# Patient Record
Sex: Female | Born: 1995 | Hispanic: Yes | Marital: Married | Smoking: Never smoker
Health system: Southern US, Community
[De-identification: ages and names within clinical notes are randomized; demographics above are authoritative.]

## PROBLEM LIST (undated history)

## (undated) ENCOUNTER — Inpatient Hospital Stay: Payer: Self-pay

## (undated) DIAGNOSIS — Z8759 Personal history of other complications of pregnancy, childbirth and the puerperium: Secondary | ICD-10-CM

## (undated) HISTORY — PX: NO PAST SURGERIES: SHX2092

---

## 2014-03-21 ENCOUNTER — Ambulatory Visit: Payer: Self-pay | Admitting: Advanced Practice Midwife

## 2014-07-08 ENCOUNTER — Observation Stay
Admit: 2014-07-08 | Disposition: A | Discharge status: Home / Self Care | Payer: Self-pay | Attending: Obstetrics and Gynecology | Admitting: Obstetrics and Gynecology

## 2017-04-06 NOTE — L&D Delivery Note (Signed)
Obstetrical Delivery Note   Date of Delivery:   12/05/2017 Primary OB:   [facility] Gestational Age/EDD: [redacted]w[redacted]d (Dated by 13 week ultrasound) Antepartum complications: none  Delivered By:   Farrel Conners, CNM  Delivery Type:   spontaneous vaginal delivery  Procedure Details:   CTSP with urge to push. Anterior lip reduced with one push. Mother continued to push to deliver a vigorous female infant followed by a large gush of dark green meconium stained amniotic fluid. Baby dried and placed on mother's abdomen. Mouth suctioned with bulb syringe. After a delayed cord clamping, the cord was cut by the father of the baby. Baby then placed skin to skin on mother's chest. Spontaneous delivery of intact placenta and 3 vessel cord. Small first degree perineal laceration hemoststic and not needing repair. No vaginal or cervical lacerations seen. EBL: 350 ml. Anesthesia:    none Intrapartum complications: None GBS:    negative Laceration:    1st degree and perineal-not needing repair Episiotomy:    none Placenta:    Via active 3rd stage. To pathology: no Estimated Blood Loss:  350 ml Baby:    Liveborn female, Apgars 8/9, weight 6#11.9oz    Farrel Conners, CNM

## 2017-05-19 ENCOUNTER — Other Ambulatory Visit: Payer: Self-pay | Admitting: Advanced Practice Midwife

## 2017-05-19 DIAGNOSIS — Z369 Encounter for antenatal screening, unspecified: Secondary | ICD-10-CM

## 2017-05-20 LAB — OB RESULTS CONSOLE HEPATITIS B SURFACE ANTIGEN: HEP B S AG: NEGATIVE

## 2017-06-07 ENCOUNTER — Encounter: Payer: Self-pay | Admitting: *Deleted

## 2017-06-07 ENCOUNTER — Ambulatory Visit (HOSPITAL_BASED_OUTPATIENT_CLINIC_OR_DEPARTMENT_OTHER)
Admission: RE | Admit: 2017-06-07 | Discharge: 2017-06-07 | Disposition: A | Discharge status: Home / Self Care | Payer: Self-pay | Source: Ambulatory Visit | Attending: Obstetrics and Gynecology | Admitting: Obstetrics and Gynecology

## 2017-06-07 ENCOUNTER — Ambulatory Visit
Admission: RE | Admit: 2017-06-07 | Discharge: 2017-06-07 | Disposition: A | Discharge status: Home / Self Care | Payer: Self-pay | Source: Ambulatory Visit | Attending: Advanced Practice Midwife | Admitting: Advanced Practice Midwife

## 2017-06-07 VITALS — BP 133/73 | HR 109 | Temp 98.3°F | Resp 16 | Ht 59.0 in | Wt 154.6 lb

## 2017-06-07 DIAGNOSIS — Z369 Encounter for antenatal screening, unspecified: Secondary | ICD-10-CM

## 2017-06-07 DIAGNOSIS — Z3A13 13 weeks gestation of pregnancy: Secondary | ICD-10-CM | POA: Insufficient documentation

## 2017-06-07 DIAGNOSIS — Z315 Encounter for genetic counseling: Secondary | ICD-10-CM | POA: Insufficient documentation

## 2017-06-07 DIAGNOSIS — Z3682 Encounter for antenatal screening for nuchal translucency: Secondary | ICD-10-CM | POA: Insufficient documentation

## 2017-06-07 NOTE — Progress Notes (Addendum)
Department, [place]* Length of Consultation: 30 minutes   [name]  [facility] to review prenatal screening and testing options.  This note summarizes the information we discussed with the aid of a Spanish interpreter.    We offered the following routine screening tests for this pregnancy:  First trimester screening, which includes nuchal translucency ultrasound screen and first trimester maternal serum marker screening.  The nuchal translucency has approximately an 80% detection rate for Down syndrome and can be positive for other chromosome abnormalities as well as congenital heart defects.  When combined with a maternal serum marker screening, the detection rate is up to 90% for Down syndrome and up to 97% for trisomy 18.     Maternal serum marker screening, a blood test that measures pregnancy proteins, can provide risk assessments for Down syndrome, trisomy 18, and open neural tube defects (spina bifida, anencephaly). Because it does not directly examine the fetus, it cannot positively diagnose or rule out these problems.  Targeted ultrasound uses high frequency sound waves to create an image of the developing fetus.  An ultrasound is often recommended as a routine means of evaluating the pregnancy.  It is also used to screen for fetal anatomy problems (for example, a heart defect) that might be suggestive of a chromosomal or other abnormality.   Should these screening tests indicate an increased concern, then the following additional testing options would be offered:  The chorionic villus sampling procedure is available for first trimester chromosome analysis.  This involves the withdrawal of a small amount of chorionic villi (tissue from the developing placenta).  Risk of pregnancy loss is estimated to be approximately 1 in 200 to 1 in 100 (0.5 to 1%).  There is approximately a 1% (1 in 100) chance that the  CVS chromosome results will be unclear.  Chorionic villi cannot be tested for neural tube defects.     Amniocentesis involves the removal of a small amount of amniotic fluid from the sac surrounding the fetus with the use of a thin needle inserted through the maternal abdomen and uterus.  Ultrasound guidance is used throughout the procedure.  Fetal cells from amniotic fluid are directly evaluated and > 99.5% of chromosome problems and > 98% of open neural tube defects can be detected. This procedure is generally performed after the 15th week of pregnancy.  The main risks to this procedure include complications leading to miscarriage in less than 1 in 200 cases (0.5%).  As another option for information if the pregnancy is suspected to be an an increased chance for certain chromosome conditions, we also reviewed the availability of cell free fetal DNA testing from maternal blood to determine whether or not the baby may have either Down syndrome, trisomy 2613, or trisomy 10018.  This test utilizes a maternal blood sample and DNA sequencing technology to isolate circulating cell free fetal DNA from maternal plasma.  The fetal DNA can then be analyzed for DNA sequences that are derived from the three most common chromosomes involved in aneuploidy, chromosomes 13, 18, and 21.  If the overall amount of DNA is greater than the expected level for any of these chromosomes, aneuploidy is suspected.  While we do not consider it a replacement for invasive testing and karyotype analysis, a negative result from this testing would be reassuring, though not a guarantee of a normal chromosome complement for the baby.  An abnormal result is certainly suggestive of  an abnormal chromosome complement, though we would still recommend CVS or amniocentesis to confirm any findings from this testing.  Cystic Fibrosis and Spinal Muscular Atrophy (SMA) screening were also discussed with the patient. Both conditions are recessive, which means  that both parents must be carriers in order to have a child with the disease.  Cystic fibrosis (CF) is one of the most common genetic conditions in persons of Caucasian ancestry.  This condition occurs in approximately 1 in 2,500 Caucasian persons and results in thickened secretions in the lungs, digestive, and reproductive systems.  For a baby to be at risk for having CF, both of the parents must be carriers for this condition.  Approximately 1 in 63 Caucasian persons is a carrier for CF.  Current carrier testing looks for the most common mutations in the gene for CF and can detect approximately 90% of carriers in the Caucasian population.  This means that the carrier screening can greatly reduce, but cannot eliminate, the chance for an individual to have a child with CF.  If an individual is found to be a carrier for CF, then carrier testing would be available for the partner. As part of [place]'s newborn screening profile, all babies born in the state of [place] will have a two-tier screening process.  Specimens are first tested to determine the concentration of immunoreactive trypsinogen (IRT).  The top 5% of specimens with the highest IRT values then undergo DNA testing using a panel of over 40 common CF mutations. SMA is a neurodegenerative disorder that leads to atrophy of skeletal muscle and overall weakness.  This condition is also more prevalent in the Caucasian population, with 1 in 40-1 in 60 persons being a carrier and 1 in 6,000-1 in 10,000 children being affected.  There are multiple forms of the disease, with some causing death in infancy to other forms with survival into adulthood.  The genetics of SMA is complex, but carrier screening can detect up to 95% of carriers in the Caucasian population.  Similar to CF, a negative result can greatly reduce, but cannot eliminate, the chance to have a child with SMA.  We obtained a detailed family history and pregnancy history.  The family  history was reported to be unremarkable for birth defects, intellectual delays, recurrent pregnancy loss or known chromosome abnormalities.  [name] stated that this is the second pregnancy for she and her partner, Heather Shea.  She reported no complications or exposures that would be expected to increase the risk for birth defects.  After consideration of the options, [name] elected to proceed with an ultrasound and first trimester screening.  An ultrasound was performed at the time of the visit.  The gestational age was consistent with 13 weeks.  Fetal anatomy could not be assessed due to early gestational age.  Please refer to the ultrasound report for details of that study.  [name] was encouraged to call with questions or concerns.  We can be contacted at [phone].    Cherly Anderson, MS, CGC  Katrina Stack, MS, CGC performed an integral service incident to the physician's initial service.  I was physically present in the clinical area and was immediately available to render assistance.   Elpidia Karn C Tryton Bodi

## 2017-06-10 ENCOUNTER — Telehealth: Payer: Self-pay | Admitting: Obstetrics and Gynecology

## 2017-06-10 NOTE — Telephone Encounter (Signed)
  [name] elected to undergo First Trimester screening on 06/07/2017.  To review, first trimester screening, includes nuchal translucency ultrasound screen and/or first trimester maternal serum marker screening.  The nuchal translucency has approximately an 80% detection rate for Down syndrome and can be positive for other chromosome abnormalities as well as heart defects.  When combined with a maternal serum marker screening, the detection rate is up to 90% for Down syndrome and up to 97% for trisomy 13 and 18.     The results of the First Trimester Nuchal Translucency and Biochemical Screening were within normal range.  The risk for Down syndrome is now estimated to be less than 1 in 10,000.  The risk for Trisomy 13/18 is also estimated to be less than 1 in 10,000.  Should more definitive information be desired, we would offer amniocentesis.  Because we do not yet know the effectiveness of combined first and second trimester screening, we do not recommend a maternal serum screen to assess the chance for chromosome conditions.  However, if screening for neural tube defects is desired, maternal serum screening for AFP only can be performed between 15 and [redacted] weeks gestation.      Heather Andersoneborah F. Kenae Lindquist, MS, CGC

## 2017-06-28 ENCOUNTER — Other Ambulatory Visit: Payer: Self-pay | Admitting: *Deleted

## 2017-06-28 DIAGNOSIS — Z3689 Encounter for other specified antenatal screening: Secondary | ICD-10-CM

## 2017-06-28 DIAGNOSIS — Z369 Encounter for antenatal screening, unspecified: Secondary | ICD-10-CM

## 2017-07-01 ENCOUNTER — Ambulatory Visit
Admission: RE | Admit: 2017-07-01 | Discharge: 2017-07-01 | Disposition: A | Discharge status: Home / Self Care | Payer: Self-pay | Source: Ambulatory Visit | Attending: Obstetrics & Gynecology | Admitting: Obstetrics & Gynecology

## 2017-07-01 ENCOUNTER — Other Ambulatory Visit: Payer: Self-pay

## 2017-07-01 DIAGNOSIS — Z3689 Encounter for other specified antenatal screening: Secondary | ICD-10-CM | POA: Insufficient documentation

## 2017-07-01 DIAGNOSIS — Z3A17 17 weeks gestation of pregnancy: Secondary | ICD-10-CM | POA: Insufficient documentation

## 2017-09-13 LAB — OB RESULTS CONSOLE HIV ANTIBODY (ROUTINE TESTING): HIV: NONREACTIVE

## 2017-09-13 LAB — OB RESULTS CONSOLE RPR: RPR: NONREACTIVE

## 2017-09-27 ENCOUNTER — Other Ambulatory Visit: Payer: Self-pay

## 2017-09-27 ENCOUNTER — Observation Stay
Admission: EM | Admit: 2017-09-27 | Discharge: 2017-09-27 | Disposition: A | Discharge status: Home / Self Care | Payer: Self-pay | Attending: Obstetrics and Gynecology | Admitting: Obstetrics and Gynecology

## 2017-09-27 ENCOUNTER — Encounter: Payer: Self-pay | Admitting: Maternal Newborn

## 2017-09-27 DIAGNOSIS — O368190 Decreased fetal movements, unspecified trimester, not applicable or unspecified: Secondary | ICD-10-CM | POA: Diagnosis present

## 2017-09-27 DIAGNOSIS — Z3A29 29 weeks gestation of pregnancy: Secondary | ICD-10-CM | POA: Insufficient documentation

## 2017-09-27 DIAGNOSIS — O368130 Decreased fetal movements, third trimester, not applicable or unspecified: Secondary | ICD-10-CM

## 2017-09-27 NOTE — Final Progress Note (Signed)
Physician Final Progress Note  Patient ID: Heather BottomsLorena Sibrian Alberto MRN: [id number] DOB/AGE: [birth year] 22 y.o.  Admit date: 09/27/2017 Admitting provider: Conard NovakStephen D Jackson, MD Discharge date: 09/27/2017   Admission Diagnoses: Decreased fetal movement, difficulty finding FHT in clinic.  Discharge Diagnoses:  Active Problems:   * No active hospital problems. *   History of Present Illness: The patient is a 22 y.o. female G2P0 at 6655w5d who presents for a check on fetal well-being. She had not felt strong fetal movements for a period of two days. She thinks she felt a slight movement at 1500 today. Her provider in clinic had difficulty  finding FHT on Doppler and NST and sent her here for further evaluation. Denies contractions, vaginal bleeding, and loss of fluid.  ROS: Review of systems negative unless otherwise noted in HPI  History reviewed. No pertinent past medical history.  History reviewed. No pertinent surgical history.  No current facility-administered medications on file prior to encounter.    Current Outpatient Medications on File Prior to Encounter  Medication Sig Dispense Refill  . Prenatal Vit-Fe Fumarate-FA (PRENATAL MULTIVITAMIN) TABS tablet Take 1 tablet by mouth daily at 12 noon.      No Known Allergies  Social History   Socioeconomic History  . Marital status: Single    Spouse name: Not on file  . Number of children: Not on file  . Years of education: Not on file  . Highest education level: Not on file  Occupational History  . Not on file  Social Needs  . Financial resource strain: Not on file  . Food insecurity:    Worry: Not on file    Inability: Not on file  . Transportation needs:    Medical: Not on file    Non-medical: Not on file  Tobacco Use  . Smoking status: Never Smoker  . Smokeless tobacco: Never Used  Substance and Sexual Activity  . Alcohol use: No    Frequency: Never  . Drug use: No  . Sexual activity: Yes  Lifestyle  . Physical  activity:    Days per week: Not on file    Minutes per session: Not on file  . Stress: Not on file  Relationships  . Social connections:    Talks on phone: Not on file    Gets together: Not on file    Attends religious service: Not on file    Active member of club or organization: Not on file    Attends meetings of clubs or organizations: Not on file    Relationship status: Not on file  . Intimate partner violence:    Fear of current or ex partner: Not on file    Emotionally abused: Not on file    Physically abused: Not on file    Forced sexual activity: Not on file  Other Topics Concern  . Not on file  Social History Narrative  . Not on file   History reviewed. No pertinent family history.   Physical Exam: BP 132/67   Pulse (!) 117   Temp 98.2 F (36.8 C) (Oral)   Resp 18   LMP 03/12/2017   Gen: NAD CV: Tachycardia Pulm: No increased work of breathing Pelvic: Deferred Ext: No signs of DVT  Bedside ultrasound performed by Dr. Thomasene MohairStephen Jackson showed good fetal cardiac activity in the 150s, cephalic singleton with anterior placenta, fetal movements present.  NST Baseline: 150 Variability: moderate Accelerations: present, 15x15 Decelerations: absent Tocometry: none The patient was monitored for 40+ minutes,  fetal heart rate tracing was deemed reactive, category I tracing.  Consults: None  Procedures: Bedside ultrasound, NST  Discharge Condition: good  Disposition: Discharge disposition: 01-Home or Self Care       Diet: Regular diet  Discharge Activity: Activity as tolerated  Discharge Instructions    Discharge activity:  No Restrictions   Complete by:  As directed    Discharge diet:  No restrictions   Complete by:  As directed    No sexual activity restrictions   Complete by:  As directed    Notify physician for a general feeling that "something is not right"   Complete by:  As directed    Notify physician for increase or change in vaginal  discharge   Complete by:  As directed    Notify physician for intestinal cramps, with or without diarrhea, sometimes described as "gas pain"   Complete by:  As directed    Notify physician for leaking of fluid   Complete by:  As directed    Notify physician for low, dull backache, unrelieved by heat or Tylenol   Complete by:  As directed    Notify physician for menstrual like cramps   Complete by:  As directed    Notify physician for pelvic pressure   Complete by:  As directed    Notify physician for uterine contractions.  These may be painless and feel like the uterus is tightening or the baby is  "balling up"   Complete by:  As directed    Notify physician for vaginal bleeding   Complete by:  As directed    PRETERM LABOR:  Includes any of the follwing symptoms that occur between 20 - [redacted] weeks gestation.  If these symptoms are not stopped, preterm labor can result in preterm delivery, placing your baby at risk   Complete by:  As directed      Allergies as of 09/27/2017   No Known Allergies     Medication List    TAKE these medications   prenatal multivitamin Tabs tablet Take 1 tablet by mouth daily at 12 noon.      Patient reassured with normal fetal findings, advised to return to triage with decreased fetal movement or signs of preterm labor.  Signed: Oswaldo Conroy, CNM  09/27/2017, 8:14 PM

## 2017-09-27 NOTE — OB Triage Note (Signed)
Ms. Heather OverlieSibrian Shea sent over from [facility] with decreased fetal movement, provider at [facility] unable to find FHT with doppler or US. Pt reports decreased fetal movement for 2 days, states she felt "a small movement" at approx 3 pm today, denies bleeding, LOF, falls, accidents.

## 2017-09-27 NOTE — Discharge Summary (Signed)
See Final Progress Note 09/27/2017.

## 2017-11-15 LAB — OB RESULTS CONSOLE GC/CHLAMYDIA
Chlamydia: NEGATIVE
Gonorrhea: NEGATIVE

## 2017-11-15 LAB — OB RESULTS CONSOLE RUBELLA ANTIBODY, IGM: RUBELLA: IMMUNE

## 2017-11-15 LAB — OB RESULTS CONSOLE GBS: STREP GROUP B AG: NEGATIVE

## 2017-11-15 LAB — OB RESULTS CONSOLE VARICELLA ZOSTER ANTIBODY, IGG: VARICELLA IGG: NON-IMMUNE/NOT IMMUNE

## 2017-12-05 ENCOUNTER — Encounter: Payer: Self-pay | Admitting: Certified Nurse Midwife

## 2017-12-05 ENCOUNTER — Other Ambulatory Visit: Payer: Self-pay

## 2017-12-05 ENCOUNTER — Inpatient Hospital Stay
Admission: EM | Admit: 2017-12-05 | Discharge: 2017-12-07 | DRG: 807 | Disposition: A | Discharge status: Home / Self Care | Payer: Medicaid Other | Attending: Obstetrics and Gynecology | Admitting: Obstetrics and Gynecology

## 2017-12-05 DIAGNOSIS — Z3483 Encounter for supervision of other normal pregnancy, third trimester: Secondary | ICD-10-CM | POA: Diagnosis present

## 2017-12-05 DIAGNOSIS — Z3A39 39 weeks gestation of pregnancy: Secondary | ICD-10-CM | POA: Diagnosis not present

## 2017-12-05 HISTORY — DX: Personal history of other complications of pregnancy, childbirth and the puerperium: Z87.59

## 2017-12-05 LAB — CBC
HCT: 34.4 % — ABNORMAL LOW (ref 35.0–47.0)
HEMOGLOBIN: 11.5 g/dL — AB (ref 12.0–16.0)
MCH: 28.1 pg (ref 26.0–34.0)
MCHC: 33.3 g/dL (ref 32.0–36.0)
MCV: 84.3 fL (ref 80.0–100.0)
PLATELETS: 295 10*3/uL (ref 150–440)
RBC: 4.09 MIL/uL (ref 3.80–5.20)
RDW: 15.6 % — ABNORMAL HIGH (ref 11.5–14.5)
WBC: 13.9 10*3/uL — ABNORMAL HIGH (ref 3.6–11.0)

## 2017-12-05 LAB — TYPE AND SCREEN
ABO/RH(D): A POS
Antibody Screen: NEGATIVE

## 2017-12-05 MED ORDER — PRENATAL MULTIVITAMIN CH
1.0000 | ORAL_TABLET | Freq: Every day | ORAL | Status: DC
  Administered 2017-12-06 – 2017-12-07 (×2): 1 via ORAL
  Filled 2017-12-05 (×2): qty 1

## 2017-12-05 MED ORDER — COCONUT OIL OIL
1.0000 "application " | TOPICAL_OIL | Status: DC | PRN
  Filled 2017-12-05: qty 120

## 2017-12-05 MED ORDER — LIDOCAINE HCL (PF) 1 % IJ SOLN: 30.0000 mL | INTRAMUSCULAR | Status: DC | PRN

## 2017-12-05 MED ORDER — AMMONIA AROMATIC IN INHA
RESPIRATORY_TRACT | Status: AC
  Filled 2017-12-05: qty 10

## 2017-12-05 MED ORDER — FERROUS SULFATE 325 (65 FE) MG PO TABS
325.0000 mg | ORAL_TABLET | Freq: Every day | ORAL | Status: DC
  Administered 2017-12-06 – 2017-12-07 (×2): 325 mg via ORAL
  Filled 2017-12-05 (×2): qty 1

## 2017-12-05 MED ORDER — LIDOCAINE HCL (PF) 1 % IJ SOLN
INTRAMUSCULAR | Status: AC
  Filled 2017-12-05: qty 30

## 2017-12-05 MED ORDER — IBUPROFEN 600 MG PO TABS
600.0000 mg | ORAL_TABLET | Freq: Four times a day (QID) | ORAL | Status: DC
  Administered 2017-12-05 – 2017-12-07 (×7): 600 mg via ORAL
  Filled 2017-12-05 (×6): qty 1

## 2017-12-05 MED ORDER — SIMETHICONE 80 MG PO CHEW: 80.0000 mg | CHEWABLE_TABLET | ORAL | Status: DC | PRN

## 2017-12-05 MED ORDER — BUTORPHANOL TARTRATE 1 MG/ML IJ SOLN
1.0000 mg | INTRAMUSCULAR | Status: DC | PRN
  Administered 2017-12-05: 1 mg via INTRAVENOUS

## 2017-12-05 MED ORDER — DIBUCAINE 1 % RE OINT: 1.0000 "application " | TOPICAL_OINTMENT | RECTAL | Status: DC | PRN

## 2017-12-05 MED ORDER — MISOPROSTOL 200 MCG PO TABS: 800.0000 ug | ORAL_TABLET | Freq: Once | ORAL | Status: DC | PRN

## 2017-12-05 MED ORDER — ONDANSETRON HCL 4 MG PO TABS: 4.0000 mg | ORAL_TABLET | ORAL | Status: DC | PRN

## 2017-12-05 MED ORDER — BENZOCAINE-MENTHOL 20-0.5 % EX AERO: 1.0000 "application " | INHALATION_SPRAY | CUTANEOUS | Status: DC | PRN

## 2017-12-05 MED ORDER — ONDANSETRON HCL 4 MG/2ML IJ SOLN: 4.0000 mg | Freq: Four times a day (QID) | INTRAMUSCULAR | Status: DC | PRN

## 2017-12-05 MED ORDER — AMMONIA AROMATIC IN INHA: 0.3000 mL | Freq: Once | RESPIRATORY_TRACT | Status: DC | PRN

## 2017-12-05 MED ORDER — LACTATED RINGERS IV SOLN
INTRAVENOUS | Status: DC
  Administered 2017-12-05: 10:00:00 via INTRAVENOUS

## 2017-12-05 MED ORDER — BUTORPHANOL TARTRATE 2 MG/ML IJ SOLN
INTRAMUSCULAR | Status: AC
  Administered 2017-12-05: 1 mg via INTRAVENOUS
  Filled 2017-12-05: qty 1

## 2017-12-05 MED ORDER — OXYTOCIN 40 UNITS IN LACTATED RINGERS INFUSION - SIMPLE MED
INTRAVENOUS | Status: AC
  Administered 2017-12-05: 500 mL via INTRAVENOUS
  Filled 2017-12-05: qty 1000

## 2017-12-05 MED ORDER — SENNOSIDES-DOCUSATE SODIUM 8.6-50 MG PO TABS
2.0000 | ORAL_TABLET | ORAL | Status: DC
  Administered 2017-12-05 – 2017-12-07 (×2): 2 via ORAL
  Filled 2017-12-05 (×2): qty 2

## 2017-12-05 MED ORDER — LACTATED RINGERS IV SOLN
500.0000 mL | INTRAVENOUS | Status: DC | PRN
  Administered 2017-12-05: 500 mL via INTRAVENOUS

## 2017-12-05 MED ORDER — OXYCODONE HCL 5 MG PO TABS: 5.0000 mg | ORAL_TABLET | Freq: Four times a day (QID) | ORAL | Status: DC | PRN

## 2017-12-05 MED ORDER — OXYTOCIN BOLUS FROM INFUSION
500.0000 mL | Freq: Once | INTRAVENOUS | Status: AC
  Administered 2017-12-05: 500 mL via INTRAVENOUS

## 2017-12-05 MED ORDER — OXYTOCIN 10 UNIT/ML IJ SOLN
INTRAMUSCULAR | Status: AC
  Filled 2017-12-05: qty 2

## 2017-12-05 MED ORDER — OXYTOCIN 40 UNITS IN LACTATED RINGERS INFUSION - SIMPLE MED
2.5000 [IU]/h | INTRAVENOUS | Status: DC
  Administered 2017-12-05: 2.5 [IU]/h via INTRAVENOUS

## 2017-12-05 MED ORDER — ONDANSETRON HCL 4 MG/2ML IJ SOLN: 4.0000 mg | INTRAMUSCULAR | Status: DC | PRN

## 2017-12-05 MED ORDER — WITCH HAZEL-GLYCERIN EX PADS: 1.0000 "application " | MEDICATED_PAD | CUTANEOUS | Status: DC | PRN

## 2017-12-05 MED ORDER — MISOPROSTOL 200 MCG PO TABS
ORAL_TABLET | ORAL | Status: AC
  Filled 2017-12-05: qty 4

## 2017-12-05 NOTE — Lactation Note (Signed)
This note was copied from a baby's chart. Lactation Consultation Note  Patient Name: Heather Shea [id number] Date: 12/05/2017  Assisted mom with second breast feed since delivery.  [name] latches with minimal assistance and immediately begins strong rhythmic sucking with occasional swallows.  Through Spanish interpreter reviewed supply and demand, routine newborn feeding patterns and normal course of lactation.  Mom reports breast feeding first baby for 3 months without problems.  Lactation name and number written on white board and encouraged to call with any questions, concerns or assistance.   Maternal Data    Feeding    LATCH Score                   Interventions    Lactation Tools Discussed/Used     Consult Status      Louis Meckel 12/05/2017, 8:58 PM

## 2017-12-05 NOTE — Plan of Care (Signed)
Pt. Is alert and oriented with quiet affect. V/O of Pain rating and Pan medication availability and POC. V/O of Database administrator and Security and Safe Sleep for [employer]. POC, Medications and Education via Midwife [phone]

## 2017-12-05 NOTE — H&P (Signed)
OB History & Physical   History of Present Illness:  Chief Complaint:  Per interpretor: Water broke at 0400 and was green in color. Contractions started at that time and are frequent and strong." HPI:  Heather Shea is a 22 y.o. G37P1001 Hispanic female with EDC=12/08/2017 at [redacted]w[redacted]d dated by 13 week ultrasound.  Her pregnancy has been complicated by a history of SGA baby with first pregnancy. .  She presents to L&D for evaluation of labor.     Prenatal care site: Prenatal care at [facility] has been remarkable for a 20# weight gain. She desires to breast and bottle feed. Received TDAP 09/13/2017. Had an anatomy scan at 17 weeks which revealed normal growth but was incomplete for anatomy. Follow up anatomy scan was not done. Placenta was anterior.      Maternal Medical History:   Past Medical History:  Diagnosis Date  . History of prior pregnancy with SGA newborn     Past Surgical History:  Procedure Laterality Date  . NO PAST SURGERIES      No Known Allergies  Prior to Admission medications   Medication Sig Start Date End Date Taking? Authorizing Provider  Prenatal Vit-Fe Fumarate-FA (PRENATAL MULTIVITAMIN) TABS tablet Take 1 tablet by mouth daily at 12 noon.    [provider]       Social History: She  reports that she has never smoked. She has never used smokeless tobacco. She reports that she does not drink alcohol or use drugs.  Family History: negative for cancer, diabetes, hypertension  Review of Systems: Negative x 10 systems reviewed except as noted in the HPI.      Physical Exam:  Vital Signs: 135/76 pulse 86   General: Hispanic female breathing thru contractions  HEENT: normocephalic, atraumatic Heart: regular rate & rhythm.  No murmurs/rubs/gallops Lungs: clear to auscultation bilaterally Abdomen: soft, gravid, non-tender;  EFW: 6# Pelvic:   External: Normal external female genitalia  Cervix: 5-6/90%/ -1 on arrival and  rapidly progressed to 8cm/C/-1  Extremities: non-tender, symmetric, trace  edema bilaterally.  DTRs: +3 with 1 beat clonus  Neurologic: Alert & oriented x 3.    Pertinent Results:  Prenatal Labs: Blood type/Rh A positive  Antibody screen negative  Rubella Varicella Immune Nonimmune  RPR Non reactive  HBsAg negative  HIV Non reactive  GC negative  Chlamydia negative  Genetic screening Negative AFP  1 hour GTT 125  3 hour GTT NA  GBS negative on 11/15/17   Baseline FHR: 145 with accelerations to 160s, moderate variability Toco: contractions every 1-2 min apart      Assessment:  Heather Shea is a 22 y.o. G7P1001 female at [redacted]w[redacted]d in active labor ?MSAF FWB: Cat 1 tracing  Plan:  1. Admit to Labor & Delivery-anticipate vaginal delivery 2. CBC, T&S, Clrs, IVF 3. GBS negative.   4. Consents obtained. 5. Stadol for pain 6. Breast and bottle 7. A POS/ RI/ varicella nonimmune-offer Varivax prior to discharge 8. Contraception: Nexplanon 9. TDAP-given 09/13/2017   Farrel Conners  12/05/2017 9:40 AM

## 2017-12-05 NOTE — Lactation Note (Addendum)
This note was copied from a baby's chart. Lactation Consultation Note  Patient Name: Heather Shea Date: 12/05/2017  Assisted mom with latching to right breast in sidelying position.  [name] having more difficulty sustaining latch on right breast probably because it is more flat than left breast and cannot get as deep of a latch.  [name] was more sleepy for this feeding as well and only breast fed for less than 5 minutes requiring more assistance with latching and keeping him actively sucking at the breast.   Maternal Data    Feeding    LATCH Score                   Interventions    Lactation Tools Discussed/Used     Consult Status      Louis Meckel 12/05/2017, 9:07 PM

## 2017-12-05 NOTE — Lactation Note (Signed)
This note was copied from a baby's chart. Lactation Consultation Note  Patient Name: Boy Nikesha Adema [id number] Date: 12/05/2017  Assisted mom with first breastfeed.  Mom has flattish nipples.  Demonstrated hand expression of colostrum.  [name] opens mouth wide and latches well.  He begins strong rhythmic sucking with an occasional swallow.  When he came off the breast, nipple was more everted.       Maternal Data    Feeding    LATCH Score                   Interventions    Lactation Tools Discussed/Used     Consult Status      Louis Meckel 12/05/2017, 8:51 PM

## 2017-12-05 NOTE — Progress Notes (Signed)
Video Interpreter accessed to review Motrin and Senokot and Pt. Pain Level. Pt. V/O of purpose and most common side effects of both medications via Interpreter Service. Interpreter: Byrd Hesselbach # [phone]

## 2017-12-05 NOTE — Discharge Summary (Signed)
Physician Obstetric Discharge Summary  Patient ID: Heather Shea MRN: [id number] DOB/AGE: 08/15/1995 22 y.o.   Date of Admission: 12/05/2017 Date of Delivery: 12/05/2017 Date of Discharge: 12/07/2017  Admitting Diagnosis: Onset of Labor at [redacted]w[redacted]d  Secondary Diagnosis: Meconium stained amniotic fluid  Mode of Delivery: normal spontaneous vaginal delivery 12/05/2017      Discharge Diagnosis: Term intrauterine pregnancy-delivered   Intrapartum Procedures: none   Post partum procedures: none  Complications: none   Brief Hospital Course  Svetlana [name] is a [id number] who had a SVD on 12/05/2017;  for further details of this delivery, please refer to the delivery note.  Patient had an uncomplicated postpartum course.  By time of discharge on PPD#2, her pain was controlled on oral pain medications; she had appropriate lochia and was ambulating, voiding without difficulty and tolerating regular diet.  She was deemed stable for discharge to home.     Labs: CBC Latest Ref Rng & Units 12/06/2017 12/05/2017  WBC 3.6 - 11.0 K/uL 12.6(H) 13.9(H)  Hemoglobin 12.0 - 16.0 g/dL 10.4(L) 11.5(L)  Hematocrit 35.0 - 47.0 % 30.8(L) 34.4(L)  Platelets 150 - 440 K/uL 248 295   A POS/ RI/ VNI Physical exam:  Last menstrual period 03/12/2017, currently breastfeeding. General: alert and no distress Lochia: appropriate Abdomen: soft, NT Uterine Fundus: firm Incision: NA Extremities: No evidence of DVT seen on physical exam. No lower extremity edema.  Discharge Instructions: Per After Visit Summary. Activity: Advance as tolerated. Pelvic rest for 6 weeks.  Also refer to Discharge Instructions Diet: Regular Medications: Allergies as of 12/07/2017   No Known Allergies     Medication List    TAKE these medications   prenatal multivitamin Tabs tablet Take 1 tablet by mouth daily at 12 noon.      Outpatient follow up:  Follow-up Information    Department, [facility]. Schedule an  appointment as soon as possible for a visit.   Why:  for a 6 week postpartum appointment Contact information: [address] [place] [address] [phone]          Postpartum contraception: Nexplanon  Discharged Condition: good  Discharged to: home   Newborn Data:Female infant: Molli Hazard  Disposition:home with mother  Weight: 3060 g  Apgars: APGAR (1 MIN): 8   APGAR (5 MINS): 9   APGAR (10 MINS):    Baby Feeding: Bottle and Breast  Tresea Mall, CNM 12/07/2017 9:24 AM

## 2017-12-06 LAB — CBC
HCT: 30.8 % — ABNORMAL LOW (ref 35.0–47.0)
Hemoglobin: 10.4 g/dL — ABNORMAL LOW (ref 12.0–16.0)
MCH: 28.4 pg (ref 26.0–34.0)
MCHC: 33.9 g/dL (ref 32.0–36.0)
MCV: 83.8 fL (ref 80.0–100.0)
PLATELETS: 248 10*3/uL (ref 150–440)
RBC: 3.67 MIL/uL — AB (ref 3.80–5.20)
RDW: 15.6 % — ABNORMAL HIGH (ref 11.5–14.5)
WBC: 12.6 10*3/uL — AB (ref 3.6–11.0)

## 2017-12-06 LAB — RPR: RPR: NONREACTIVE

## 2017-12-06 NOTE — Lactation Note (Signed)
This note was copied from a baby's chart. Lactation Consultation Note  Patient Name: Heather Shea [id number] Date: 12/06/2017 Reason for consult: Follow-up assessment;Difficult latch;Term;Other (Comment)  Mom has been struggling with latching [name] independently requiring lactation and/or RN assistance with every feeding.  Can obtain latch after several attempts, but can not get him to remain on breast deep enough to keep actively sucking.  Mom has flat nipples and as her mature milk begins to transition in, the areola/ breasts are filling causing more difficulty to sustain latch to already flat nipples.  Several different positions tried.  The cradle or cross cradle seems to work the best for mom but still have trouble with latch.  DEBP set up in room with instructions in use.  Before pumping tried nipple shield which worked well.  [name] stayed on the breast for 25 minutes with good rhythmic sucking.  Maternal Data Formula Feeding for Exclusion: No Has patient been taught Hand Expression?: Yes(Can express lots of colostrum easily) Does the patient have breastfeeding experience prior to this delivery?: Yes  Feeding Feeding Type: Breast Fed Length of feed: 25 min  LATCH Score Latch: Repeated attempts needed to sustain latch, nipple held in mouth throughout feeding, stimulation needed to elicit sucking reflex.  Audible Swallowing: A few with stimulation  Type of Nipple: Flat  Comfort (Breast/Nipple): Soft / non-tender  Hold (Positioning): Assistance needed to correctly position infant at breast and maintain latch.  LATCH Score: 6  Interventions Interventions: Assisted with latch;DEBP;Breast compression;Adjust position;Breast massage;Support pillows;Hand express;Position options  Lactation Tools Discussed/Used Tools: Pump;Nipple Shields Nipple shield size: 20 Breast pump type: Double-Electric Breast Pump   Consult Status Consult Status: PRN Follow-up type: Call as  needed    Louis Meckel 12/06/2017, 6:30 PM

## 2017-12-06 NOTE — Progress Notes (Signed)
Patient ID: Heather Shea, female   DOB: 04-08-1995, 22 y.o.   MRN: [id number] Admit Date: 12/05/2017 Today's Date: 12/06/2017  Post Partum Day 1  Subjective:  no complaints, up ad lib, voiding, tolerating PO and + flatus.  She reports that her infant is fussy, she is having difficulty breast feeding. She has breast feed before with a previous infant.   Objective: Temp:  [98.2 F (36.8 C)-98.6 F (37 C)] 98.4 F (36.9 C) (09/02 0841) Pulse Rate:  [66-190] 66 (09/02 0841) Resp:  [16-20] 18 (09/02 0841) BP: (98-141)/(52-87) 118/60 (09/02 0841) SpO2:  [99 %-100 %] 99 % (09/02 0841)  Physical Exam:  General: alert, cooperative and appears stated age Lochia: appropriate Uterine Fundus: firm Incision: none DVT Evaluation: No evidence of DVT seen on physical exam.  Recent Labs    12/05/17 0944 12/06/17 0620  HGB 11.5* 10.4*  HCT 34.4* 30.8*    Assessment/Plan: Plan for discharge tomorrow, Breastfeeding and Infant doing well  Varicella Non-immune, will need vaccination before discharge home.  Rubella immune Rh positive.  Lactation assisting with breast feeding.    LOS: 1 day   Christanna R [facility] 12/06/2017, 9:34 AM

## 2017-12-07 MED ORDER — IBUPROFEN 600 MG PO TABS
600.0000 mg | ORAL_TABLET | Freq: Four times a day (QID) | ORAL | Status: DC
  Administered 2017-12-07: 600 mg via ORAL
  Filled 2017-12-07: qty 1

## 2017-12-07 MED ORDER — VARICELLA VIRUS VACCINE LIVE 1350 PFU/0.5ML IJ SUSR
0.5000 mL | Freq: Once | INTRAMUSCULAR | Status: AC
  Administered 2017-12-07: 0.5 mL via SUBCUTANEOUS
  Filled 2017-12-07 (×2): qty 0.5

## 2017-12-07 NOTE — Progress Notes (Addendum)
Discharge instructions given. Patient verbalizes understanding of teaching. RN asked patient about having a lactation consult before discharge to discuss getting a pump and how breastfeeding is going. Patient states she's ok with the manual pump and that she did not want to see lactation. LC updated. Patient discharged home via wheelchair at 1235.

## 2018-03-18 IMAGING — US US MFM OB COMPLETE +14 WKS
1 series · 13 of 28 positions shown · non-contrast
Comparison: none

PATIENT INFO:

KATRIN
PERFORMED BY:
SERVICE(S) PROVIDED:
INDICATIONS:
13 weeks gestation of pregnancy
First trimester screening
History of FGR in prior pregnancy
FETAL EVALUATION:
Num Of Fetuses:     1
Fetal Heart         162
Rate(bpm):
Presentation:       Transverse
Placenta:           Anterior Grade
BIOMETRY:
CRL:      70.6  mm     G. Age:  13w 2d                  EDD:   12/11/17
BPD:      22.8  mm     G. Age:  13w 5d                  CI:         69.1   %    70 - 86
FL/HC:      12.3   %
HC:       87.6  mm     G. Age:  13w 6d
FL/BPD:     47.4   %
FL:       10.8  mm     G. Age:  13w 2d
GESTATIONAL AGE:
LMP:           12w 3d        Date:  03/12/17                 EDD:   12/17/17
U/S Today:     13w 4d                                        EDD:   12/09/17
Best:          13w 4d     Det. By:  U/S (06/07/17)           EDD:   12/09/17
1ST TRIMESTER GENETIC SONOGRAM SCREENING:
Nuc Trans:       1.5  mm
ANATOMY:
Choroid Plexus:        Within normal limits   Upper Extremities:      Visualized
for gestational age
Stomach:               Seen                   Lower Extremities:      Visualized
Bladder:               Seen
CERVIX UTERUS ADNEXA:
Left Ovary
Size(cm)       2.5  x   1.8    x  2.2       Vol(ml):
Right Ovary
Size(cm)       3.5  x    2     x  2.3       Vol(ml):

[Series 1: us mfm ob complete +14 wks · 0.23mm/px · 13 of 63 slices shown]
[im 3/63]
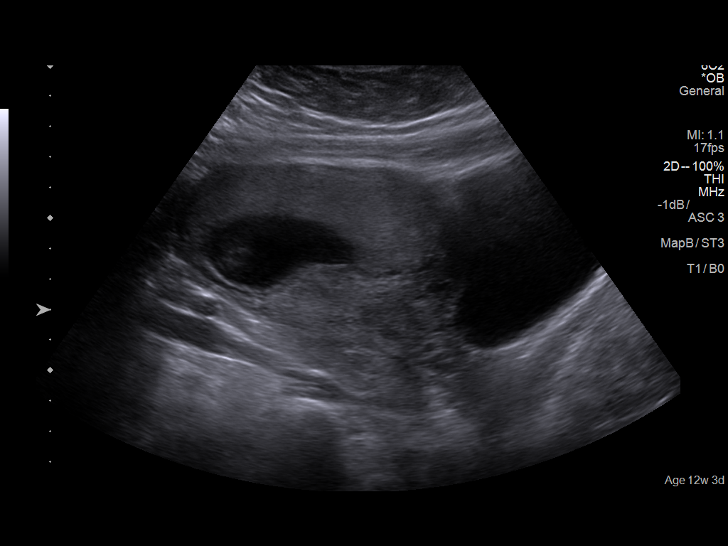
[im 7/63]
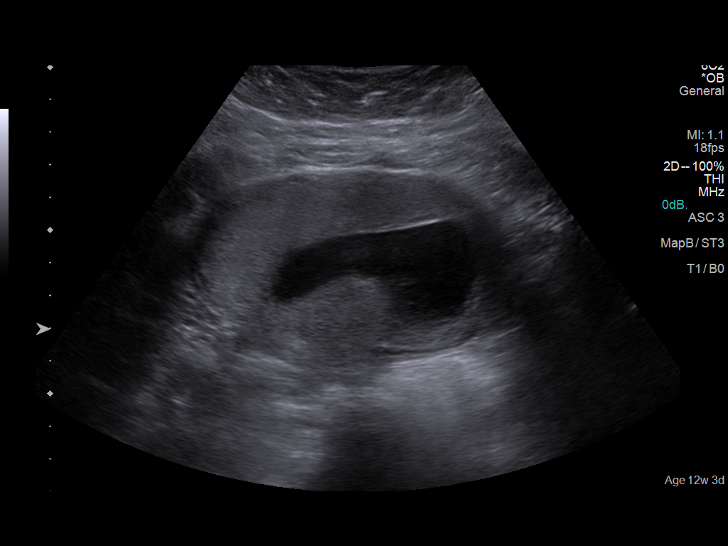
[im 12/63]
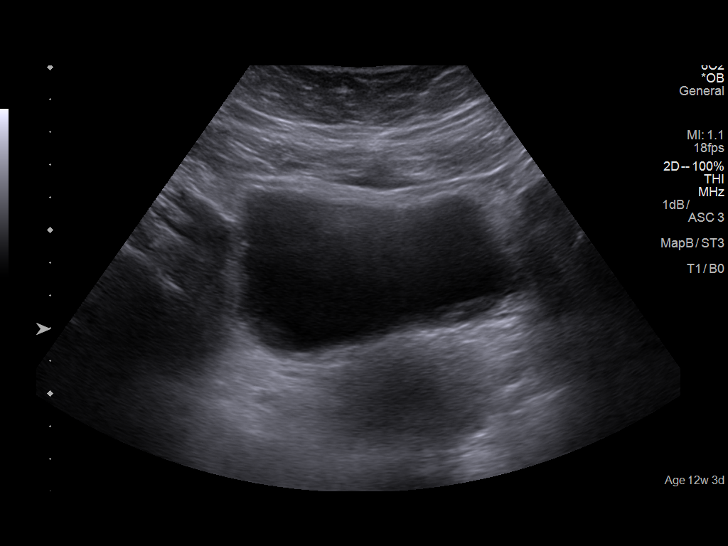
[im 17/63]
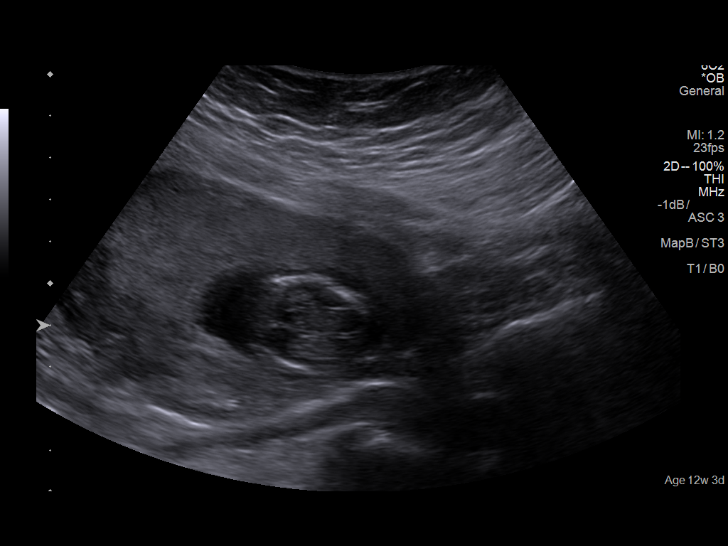
[im 21/63]
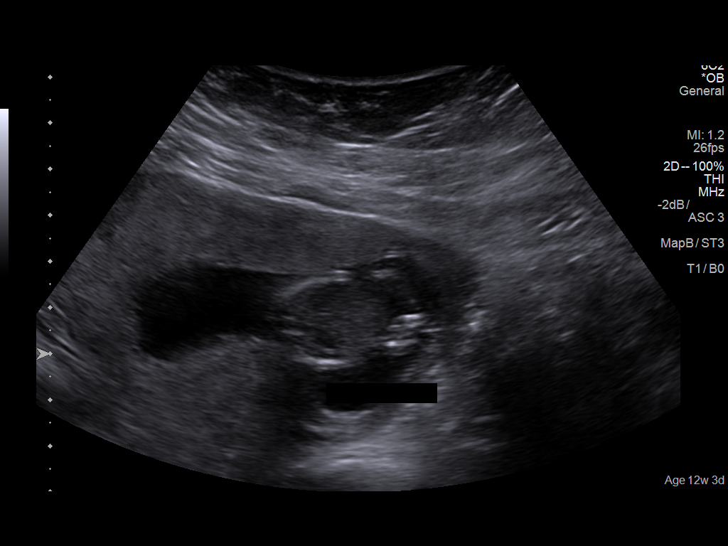
[im 26/63]
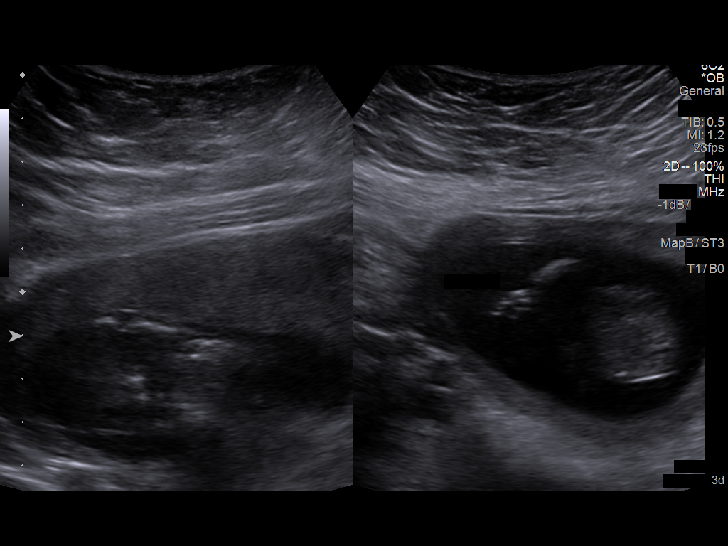
[im 33/63]
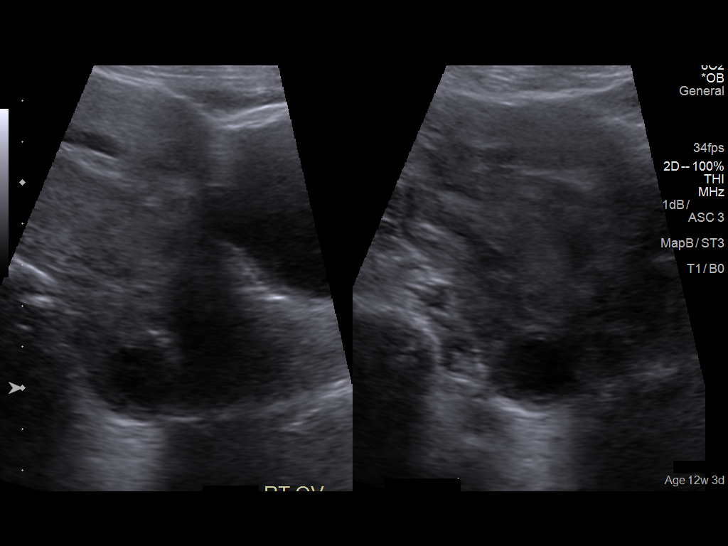
[im 37/63]
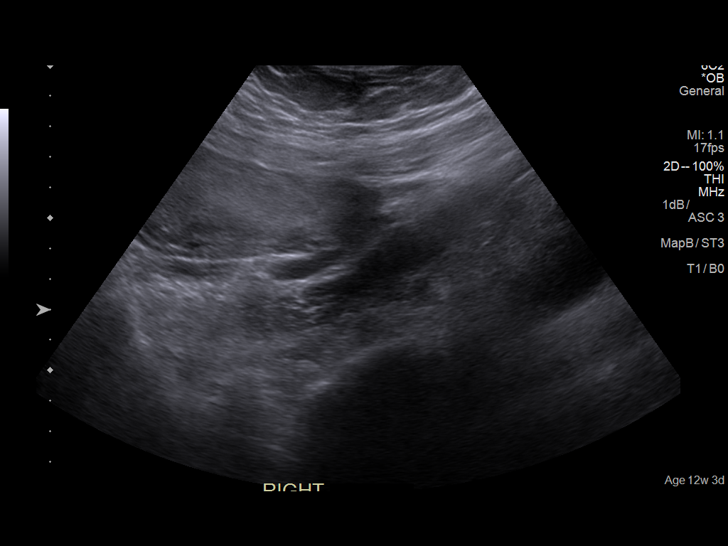
[im 42/63]
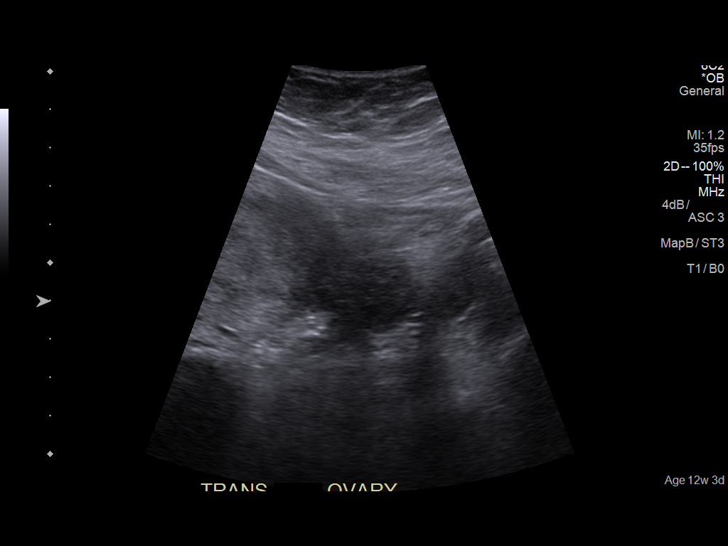
[im 46/63]
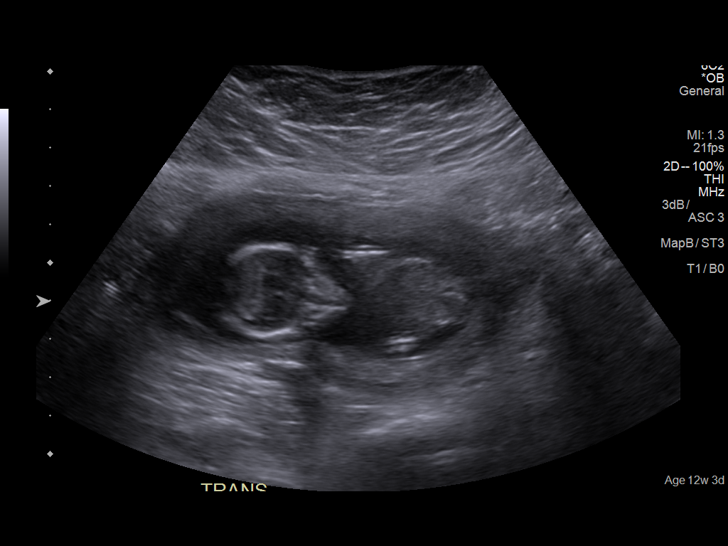
[im 51/63]
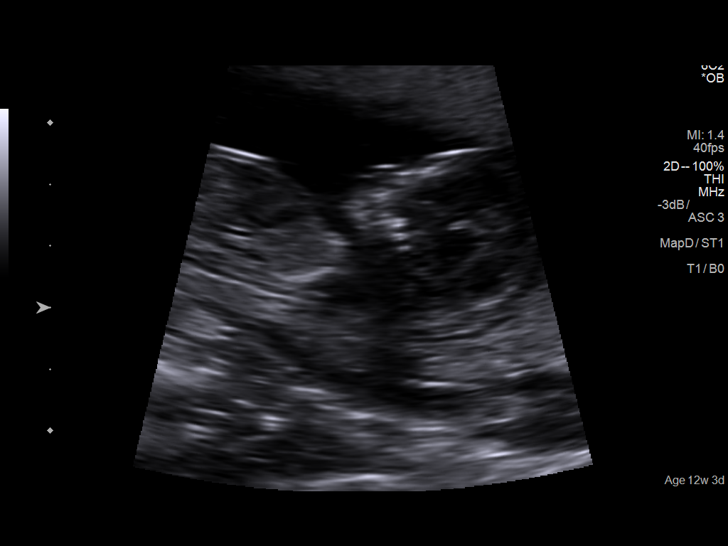
[im 56/63]
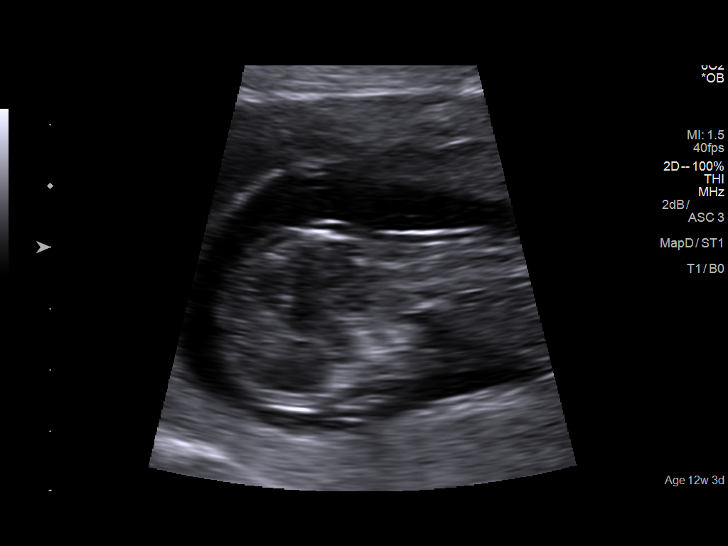
[im 60/63]
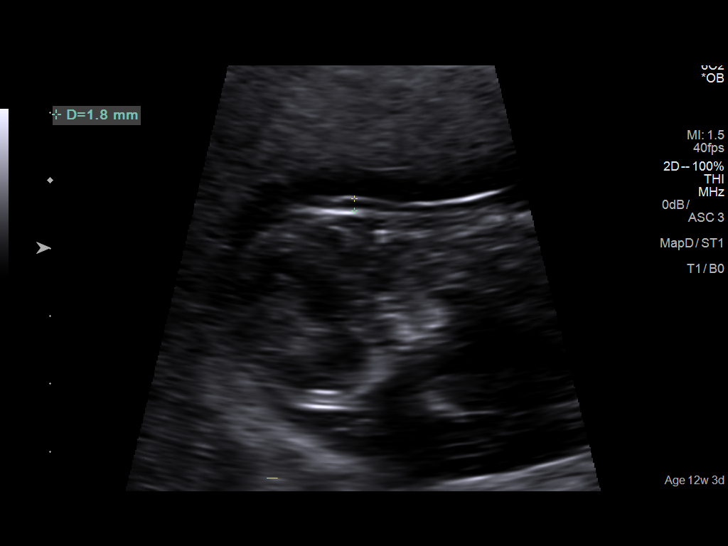

[13 of 28 positions shown; findings below may reference images not displayed]

IMPRESSION: Single intrauterine pregnancy with a best estimated
gestational age of 13 weeks 4 days.  Dating is based on
today's ultrasound given discordant menstrual dates.

Due to early gestational age, detailed images of fetal
anatomy were not evaluated.   Presence of stomach, bladder
and 4 extremities is noted.  Symmetric appearance of choroid
plexus is noted.

The best nuchal translucency measurement is 1.5 mm.
Serum analytes will be drawn today.

Both maternal ovaries appear normal; the right contains a
simple cyst measuring 1.4 x 1.6 x 1.5 cm.

Will schedule follow up ultrasound for evaluation of fetal
anatomy the end [DATE] (prior to expiration of her
Medicaid).

## 2019-07-02 ENCOUNTER — Ambulatory Visit: Payer: Self-pay | Attending: Internal Medicine

## 2019-07-02 DIAGNOSIS — Z23 Encounter for immunization: Secondary | ICD-10-CM

## 2019-07-02 NOTE — Progress Notes (Signed)
   Covid-19 Vaccination Clinic  Name:  Heather Shea    MRN: [id number] DOB: 03/07/1996  07/02/2019  Ms. [name] was observed post Covid-19 immunization for 15 minutes without incident. She was provided with Vaccine Information Sheet and instruction to access the V-Safe system.   Ms. [name] was instructed to call 911 with any severe reactions post vaccine: Marland Kitchen Difficulty breathing  . Swelling of face and throat  . A fast heartbeat  . A bad rash all over body  . Dizziness and weakness   Immunizations Administered    Name Date Dose VIS Date Route   Pfizer COVID-19 Vaccine 07/02/2019  3:49 PM 0.3 mL 03/17/2019 Intramuscular   Manufacturer: [employer], [employer]   Lot: [id number]   NDC: [address]-4

## 2019-07-23 ENCOUNTER — Ambulatory Visit: Payer: Self-pay | Attending: Internal Medicine

## 2019-07-23 DIAGNOSIS — Z23 Encounter for immunization: Secondary | ICD-10-CM

## 2019-07-23 NOTE — Progress Notes (Signed)
   Covid-19 Vaccination Clinic  Name:  Heather Shea    MRN: [id number] DOB: 03/09/1996  07/23/2019  [name] was observed post Covid-19 immunization for 15 minutes without incident. She was provided with Vaccine Information Sheet and instruction to access the V-Safe system.   [name] was instructed to call 911 with any severe reactions post vaccine: Marland Kitchen Difficulty breathing  . Swelling of face and throat  . A fast heartbeat  . A bad rash all over body  . Dizziness and weakness   Immunizations Administered    Name Date Dose VIS Date Route   Pfizer COVID-19 Vaccine 07/23/2019  4:05 PM 0.3 mL 03/17/2019 Intramuscular   Manufacturer: [employer], [employer]   Lot: [id number]   NDC: [address]-2

## 2020-01-09 ENCOUNTER — Other Ambulatory Visit: Payer: Self-pay

## 2020-01-09 ENCOUNTER — Emergency Department
Admission: EM | Admit: 2020-01-09 | Discharge: 2020-01-09 | Disposition: A | Discharge status: Home / Self Care | Payer: Self-pay | Attending: Emergency Medicine | Admitting: Emergency Medicine

## 2020-01-09 ENCOUNTER — Encounter: Payer: Self-pay | Admitting: Emergency Medicine

## 2020-01-09 DIAGNOSIS — H9202 Otalgia, left ear: Secondary | ICD-10-CM | POA: Insufficient documentation

## 2020-01-09 DIAGNOSIS — R1013 Epigastric pain: Secondary | ICD-10-CM | POA: Insufficient documentation

## 2020-01-09 DIAGNOSIS — R42 Dizziness and giddiness: Secondary | ICD-10-CM | POA: Insufficient documentation

## 2020-01-09 DIAGNOSIS — R110 Nausea: Secondary | ICD-10-CM | POA: Insufficient documentation

## 2020-01-09 LAB — COMPREHENSIVE METABOLIC PANEL
ALT: 21 U/L (ref 0–44)
AST: 20 U/L (ref 15–41)
Albumin: 4.4 g/dL (ref 3.5–5.0)
Alkaline Phosphatase: 62 U/L (ref 38–126)
Anion gap: 11 (ref 5–15)
BUN: 12 mg/dL (ref 6–20)
CO2: 22 mmol/L (ref 22–32)
Calcium: 9.4 mg/dL (ref 8.9–10.3)
Chloride: 104 mmol/L (ref 98–111)
Creatinine, Ser: 0.59 mg/dL (ref 0.44–1.00)
GFR calc Af Amer: >60 mL/min (ref >60)
GFR calc non Af Amer: >60 mL/min (ref >60)
Glucose, Bld: 132 mg/dL — ABNORMAL HIGH (ref 70–99)
Potassium: 3.9 mmol/L (ref 3.5–5.1)
Sodium: 137 mmol/L (ref 135–145)
Total Bilirubin: 0.4 mg/dL (ref 0.3–1.2)
Total Protein: 8.3 g/dL — ABNORMAL HIGH (ref 6.5–8.1)

## 2020-01-09 LAB — URINALYSIS, COMPLETE (UACMP) WITH MICROSCOPIC
Bilirubin Urine: NEGATIVE
Glucose, UA: NEGATIVE mg/dL
Ketones, ur: NEGATIVE mg/dL
Nitrite: NEGATIVE
Protein, ur: NEGATIVE mg/dL
Specific Gravity, Urine: 1.008 (ref 1.005–1.030)
pH: 6 (ref 5.0–8.0)

## 2020-01-09 LAB — CBC WITH DIFFERENTIAL/PLATELET
Abs Immature Granulocytes: 0.05 K/uL (ref 0.00–0.07)
Basophils Absolute: 0.1 K/uL (ref 0.0–0.1)
Basophils Relative: 1 %
Eosinophils Absolute: 0.3 K/uL (ref 0.0–0.5)
Eosinophils Relative: 2 %
HCT: 36.1 % (ref 36.0–46.0)
Hemoglobin: 11.7 g/dL — ABNORMAL LOW (ref 12.0–15.0)
Immature Granulocytes: 0 %
Lymphocytes Relative: 29 %
Lymphs Abs: 4.3 K/uL — ABNORMAL HIGH (ref 0.7–4.0)
MCH: 27.3 pg (ref 26.0–34.0)
MCHC: 32.4 g/dL (ref 30.0–36.0)
MCV: 84.3 fL (ref 80.0–100.0)
Monocytes Absolute: 0.7 K/uL (ref 0.1–1.0)
Monocytes Relative: 5 %
Neutro Abs: 9.3 K/uL — ABNORMAL HIGH (ref 1.7–7.7)
Neutrophils Relative %: 63 %
Platelets: 398 K/uL (ref 150–400)
RBC: 4.28 MIL/uL (ref 3.87–5.11)
RDW: 14.6 % (ref 11.5–15.5)
WBC: 14.7 K/uL — ABNORMAL HIGH (ref 4.0–10.5)
nRBC: 0 % (ref 0.0–0.2)

## 2020-01-09 LAB — TROPONIN I (HIGH SENSITIVITY): Troponin I (High Sensitivity): <2 ng/L (ref <18)

## 2020-01-09 LAB — POCT PREGNANCY, URINE: Preg Test, Ur: NEGATIVE

## 2020-01-09 MED ORDER — DICYCLOMINE HCL 10 MG PO CAPS: 10.0000 mg | ORAL_CAPSULE | Freq: Three times a day (TID) | ORAL | 0 refills | Status: AC

## 2020-01-09 MED ORDER — ONDANSETRON 4 MG PO TBDP: 4.0000 mg | ORAL_TABLET | Freq: Three times a day (TID) | ORAL | 0 refills | Status: DC | PRN

## 2020-01-09 NOTE — ED Provider Notes (Signed)
[facility] Emergency Department Provider Note  Time seen: 8:26 AM  I have reviewed the triage vital signs and the nursing notes.   HISTORY  Chief Complaint Dizziness   HPI Heather Shea is a 24 y.o. female with no significant past medical history presents to the emergency department with complaints of dizziness and nausea as well as left ear pain.  According to the patient since yesterday around 5 or 6 PM she began feeling somewhat dizzy and nauseated feeling.  Patient felt like she was going to vomit but she did not.  Patient denies any diarrhea fever cough or shortness of breath.  Patient also states is a secondary complaint over the past few days she has noticed a small bump in her left ear that has been painful.   Past Medical History:  Diagnosis Date  . History of prior pregnancy with SGA newborn     Patient Active Problem List   Diagnosis Date Noted  . Normal labor 12/05/2017  . Postpartum care following vaginal delivery 12/05/2017  . Delivery normal 12/05/2017  . First trimester screening     Past Surgical History:  Procedure Laterality Date  . NO PAST SURGERIES      Prior to Admission medications   Medication Sig Start Date End Date Taking? Authorizing Provider  Prenatal Vit-Fe Fumarate-FA (PRENATAL MULTIVITAMIN) TABS tablet Take 1 tablet by mouth daily at 12 noon.    [provider]    No Known Allergies  Family History  Problem Relation Age of Onset  . Cancer Neg Hx   . Diabetes Neg Hx   . Hypertension Neg Hx     Social History Social History   Tobacco Use  . Smoking status: Never Smoker  . Smokeless tobacco: Never Used  Substance Use Topics  . Alcohol use: No  . Drug use: No    Review of Systems Constitutional: Negative for fever. Cardiovascular: Negative for chest pain. Respiratory: Negative for shortness of breath. Gastrointestinal: Mild epigastric discomfort per patient.  Positive for nausea.   Negative for vomiting or diarrhea Genitourinary: Negative for urinary compaints Musculoskeletal: Negative for musculoskeletal complaints Neurological: Negative for headache All other ROS negative  ____________________________________________   PHYSICAL EXAM:  VITAL SIGNS: ED Triage Vitals  Enc Vitals Group     BP 01/09/20 0331 131/78     Pulse Rate 01/09/20 0331 (!) 122     Resp 01/09/20 0331 18     Temp 01/09/20 0331 98.9 F (37.2 C)     Temp Source 01/09/20 0331 Oral     SpO2 01/09/20 0546 98 %     Weight 01/09/20 0332 200 lb (90.7 kg)     Height --      Head Circumference --      Peak Flow --      Pain Score 01/09/20 0332 0     Pain Loc --      Pain Edu? --      Excl. in GC? --    Constitutional: Alert and oriented. Well appearing and in no distress. Eyes: Normal exam ENT      Head: Normocephalic and atraumatic.  Small bump within the left ear consistent with infected follicle.      Mouth/Throat: Mucous membranes are moist. Cardiovascular: Normal rate, regular rhythm.  Respiratory: Normal respiratory effort without tachypnea nor retractions. Breath sounds are clear Gastrointestinal: Soft, slight epigastric discomfort to palpation.  No rebound guarding or distention.  Otherwise benign abdomen.  Special attention paid right  upper quadrant with no tenderness identified. Musculoskeletal: Nontender with normal range of motion in all extremities.  Neurologic:  Normal speech and language. No gross focal neurologic deficits  Skin:  Skin is warm, dry and intact.  Psychiatric: Mood and affect are normal.   ____________________________________________    EKG  EKG viewed and interpreted by myself shows sinus tachycardia 110 bpm with a narrow QRS, normal axis, normal intervals, no concerning ST changes.  ____________________________________________   INITIAL IMPRESSION / ASSESSMENT AND PLAN / ED COURSE  Pertinent labs & imaging results that were available during my care  of the patient were reviewed by me and considered in my medical decision making (see chart for details).   Patient presents to the emergency department with nausea yesterday.  Overall the patient appears well, lab work is reassuring, slight epigastric discomfort on palpation otherwise benign abdomen, no right upper quadrant tenderness.  Normal LFTs and lipase.  Pregnancy test is negative.  Troponin negative.  Overall the patient appears extremely well she does have a slight leukocytosis however this is largely unchanged from prior lab work.  Given the patient's nausea we will prescribe Zofran.  Patient is left ear she has a small bump consistent with a pimple/folliculitis discussed home care.  Discussed return precautions.  Patient agreeable plan of care.  Heather Shea was evaluated in Emergency Department on 01/09/2020 for the symptoms described in the history of present illness. She was evaluated in the context of the global COVID-19 pandemic, which necessitated consideration that the patient might be at risk for infection with the SARS-CoV-2 virus that causes COVID-19. Institutional protocols and algorithms that pertain to the evaluation of patients at risk for COVID-19 are in a state of rapid change based on information released by regulatory bodies including the CDC and federal and state organizations. These policies and algorithms were followed during the patient's care in the ED.  ____________________________________________   FINAL CLINICAL IMPRESSION(S) / ED DIAGNOSES  Dizziness Nausea   Minna Antis, MD 01/09/20 0830

## 2020-01-09 NOTE — ED Triage Notes (Signed)
Patient ambulatory to triage with steady gait, without difficulty or distress noted; per video [employer] interpreter, pt reports dizziness and nausea since 6pm; denies any other accomp symptoms; denies any recent illness

## 2020-02-14 ENCOUNTER — Encounter: Payer: Self-pay | Admitting: Emergency Medicine

## 2020-02-14 ENCOUNTER — Emergency Department: Payer: Self-pay

## 2020-02-14 ENCOUNTER — Emergency Department
Admission: EM | Admit: 2020-02-14 | Discharge: 2020-02-14 | Disposition: A | Discharge status: Home / Self Care | Payer: Self-pay | Attending: Emergency Medicine | Admitting: Emergency Medicine

## 2020-02-14 ENCOUNTER — Other Ambulatory Visit: Payer: Self-pay

## 2020-02-14 DIAGNOSIS — R519 Headache, unspecified: Secondary | ICD-10-CM | POA: Insufficient documentation

## 2020-02-14 DIAGNOSIS — R202 Paresthesia of skin: Secondary | ICD-10-CM | POA: Insufficient documentation

## 2020-02-14 DIAGNOSIS — Z20822 Contact with and (suspected) exposure to covid-19: Secondary | ICD-10-CM | POA: Insufficient documentation

## 2020-02-14 LAB — CBC
HCT: 36.5 % (ref 36.0–46.0)
Hemoglobin: 11.9 g/dL — ABNORMAL LOW (ref 12.0–15.0)
MCH: 27.9 pg (ref 26.0–34.0)
MCHC: 32.6 g/dL (ref 30.0–36.0)
MCV: 85.7 fL (ref 80.0–100.0)
Platelets: 394 10*3/uL (ref 150–400)
RBC: 4.26 MIL/uL (ref 3.87–5.11)
RDW: 14 % (ref 11.5–15.5)
WBC: 15 10*3/uL — ABNORMAL HIGH (ref 4.0–10.5)
nRBC: 0 % (ref 0.0–0.2)

## 2020-02-14 LAB — COMPREHENSIVE METABOLIC PANEL
ALT: 22 U/L (ref 0–44)
AST: 19 U/L (ref 15–41)
Albumin: 4.1 g/dL (ref 3.5–5.0)
Alkaline Phosphatase: 69 U/L (ref 38–126)
Anion gap: 9 (ref 5–15)
BUN: 12 mg/dL (ref 6–20)
CO2: 24 mmol/L (ref 22–32)
Calcium: 9 mg/dL (ref 8.9–10.3)
Chloride: 104 mmol/L (ref 98–111)
Creatinine, Ser: 0.56 mg/dL (ref 0.44–1.00)
GFR, Estimated: >60 mL/min (ref >60)
Glucose, Bld: 109 mg/dL — ABNORMAL HIGH (ref 70–99)
Potassium: 3.6 mmol/L (ref 3.5–5.1)
Sodium: 137 mmol/L (ref 135–145)
Total Bilirubin: 0.5 mg/dL (ref 0.3–1.2)
Total Protein: 8.1 g/dL (ref 6.5–8.1)

## 2020-02-14 LAB — DIFFERENTIAL
Abs Immature Granulocytes: 0.06 10*3/uL (ref 0.00–0.07)
Basophils Absolute: 0.1 10*3/uL (ref 0.0–0.1)
Basophils Relative: 0 %
Eosinophils Absolute: 0.3 10*3/uL (ref 0.0–0.5)
Eosinophils Relative: 2 %
Immature Granulocytes: 0 %
Lymphocytes Relative: 28 %
Lymphs Abs: 4.1 10*3/uL — ABNORMAL HIGH (ref 0.7–4.0)
Monocytes Absolute: 0.6 10*3/uL (ref 0.1–1.0)
Monocytes Relative: 4 %
Neutro Abs: 9.8 10*3/uL — ABNORMAL HIGH (ref 1.7–7.7)
Neutrophils Relative %: 66 %

## 2020-02-14 LAB — APTT: aPTT: 45 seconds — ABNORMAL HIGH (ref 24–36)

## 2020-02-14 LAB — RESPIRATORY PANEL BY RT PCR (FLU A&B, COVID)
Influenza A by PCR: NEGATIVE
Influenza B by PCR: NEGATIVE
SARS Coronavirus 2 by RT PCR: NEGATIVE

## 2020-02-14 LAB — PROTIME-INR
INR: 1.1 (ref 0.8–1.2)
Prothrombin Time: 13.4 seconds (ref 11.4–15.2)

## 2020-02-14 LAB — POC URINE PREG, ED: Preg Test, Ur: NEGATIVE

## 2020-02-14 MED ORDER — KETOROLAC TROMETHAMINE 30 MG/ML IJ SOLN
15.0000 mg | INTRAMUSCULAR | Status: AC
  Administered 2020-02-14: 15 mg via INTRAVENOUS
  Filled 2020-02-14: qty 1

## 2020-02-14 MED ORDER — ONDANSETRON 4 MG PO TBDP: 4.0000 mg | ORAL_TABLET | Freq: Three times a day (TID) | ORAL | 0 refills | Status: AC | PRN

## 2020-02-14 MED ORDER — DIPHENHYDRAMINE HCL 50 MG/ML IJ SOLN
25.0000 mg | Freq: Once | INTRAMUSCULAR | Status: AC
  Administered 2020-02-14: 25 mg via INTRAVENOUS
  Filled 2020-02-14: qty 1

## 2020-02-14 MED ORDER — METOCLOPRAMIDE HCL 5 MG/ML IJ SOLN
10.0000 mg | Freq: Once | INTRAMUSCULAR | Status: AC
  Administered 2020-02-14: 10 mg via INTRAVENOUS
  Filled 2020-02-14: qty 2

## 2020-02-14 MED ORDER — LACTATED RINGERS IV BOLUS
1000.0000 mL | Freq: Once | INTRAVENOUS | Status: AC
  Administered 2020-02-14: 1000 mL via INTRAVENOUS

## 2020-02-14 MED ORDER — NAPROXEN 500 MG PO TABS: 500.0000 mg | ORAL_TABLET | Freq: Two times a day (BID) | ORAL | 0 refills | Status: AC

## 2020-02-14 NOTE — ED Provider Notes (Signed)
[facility] Emergency Department Provider Note  ____________________________________________  Time seen: Approximately 8:11 PM  I have reviewed the triage vital signs and the nursing notes.   HISTORY  Chief Complaint Headache  Encounter completed with Spanish video interpreter  HPI Heather Shea is a 24 y.o. female with no significant past medical history who comes the ED complaining of bilateral frontal headache for the past 2 weeks.  She also reports intermittent feeling of paresthesia to the right face.  No change in balance or coordination, no vision changes, no word finding difficulty or slurred speech.  No trauma dizziness or syncope.  She does note decreased oral intake over the past several days.  No known sick contacts.  Denies chest pain or shortness of breath or neck pain or stiffness.  No fevers.      Past Medical History:  Diagnosis Date  . History of prior pregnancy with SGA newborn      Patient Active Problem List   Diagnosis Date Noted  . Normal labor 12/05/2017  . Postpartum care following vaginal delivery 12/05/2017  . Delivery normal 12/05/2017  . First trimester screening      Past Surgical History:  Procedure Laterality Date  . NO PAST SURGERIES       Prior to Admission medications   Medication Sig Start Date End Date Taking? Authorizing Provider  dicyclomine (BENTYL) 10 MG capsule Take 1 capsule (10 mg total) by mouth 4 (four) times daily -  before meals and at bedtime for 7 days. 01/09/20 01/16/20  Minna Antis, MD  naproxen (NAPROSYN) 500 MG tablet Take 1 tablet (500 mg total) by mouth 2 (two) times daily with a meal. 02/14/20   Sharman Cheek, MD  ondansetron (ZOFRAN ODT) 4 MG disintegrating tablet Take 1 tablet (4 mg total) by mouth every 8 (eight) hours as needed for nausea or vomiting. 02/14/20   Sharman Cheek, MD  Prenatal Vit-Fe Fumarate-FA (PRENATAL MULTIVITAMIN) TABS tablet Take 1 tablet by  mouth daily at 12 noon.    [provider]     Allergies Patient has no known allergies.   Family History  Problem Relation Age of Onset  . Cancer Neg Hx   . Diabetes Neg Hx   . Hypertension Neg Hx     Social History Social History   Tobacco Use  . Smoking status: Never Smoker  . Smokeless tobacco: Never Used  Substance Use Topics  . Alcohol use: No  . Drug use: No    Review of Systems  Constitutional:   No fever or chills.  ENT:   No sore throat. No rhinorrhea. Cardiovascular:   No chest pain or syncope. Respiratory:   No dyspnea or cough. Gastrointestinal:   Negative for abdominal pain, vomiting and diarrhea.  Musculoskeletal:   Negative for focal pain or swelling All other systems reviewed and are negative except as documented above in ROS and HPI.  ____________________________________________   PHYSICAL EXAM:  VITAL SIGNS: ED Triage Vitals  Enc Vitals Group     BP 02/14/20 1536 (!) 141/79     Pulse Rate 02/14/20 1536 (!) 113     Resp 02/14/20 1536 20     Temp 02/14/20 1536 99 F (37.2 C)     Temp Source 02/14/20 1536 Oral     SpO2 02/14/20 1536 99 %     Weight 02/14/20 1537 200 lb (90.7 kg)     Height 02/14/20 1537 5' (1.524 m)     Head Circumference --  Peak Flow --      Pain Score 02/14/20 1536 9     Pain Loc --      Pain Edu? --      Excl. in GC? --     Vital signs reviewed, nursing assessments reviewed.   Constitutional:   Alert and oriented. Non-toxic appearance. Eyes:   Conjunctivae are normal. EOMI. PERRL.  No APD or nystagmus ENT      Head:   Normocephalic and atraumatic.      Nose: Normal      Mouth/Throat: Dry mucous membranes      Neck:   No meningismus. Full ROM. Hematological/Lymphatic/Immunilogical:   No cervical lymphadenopathy. Cardiovascular:   Tachycardia heart rate 105. Symmetric bilateral radial and DP pulses.  No murmurs. Cap refill less than 2 seconds. Respiratory:   Normal respiratory effort without  tachypnea/retractions. Breath sounds are clear and equal bilaterally. No wheezes/rales/rhonchi. Gastrointestinal:   Soft and nontender. Non distended. There is no CVA tenderness.  No rebound, rigidity, or guarding. Musculoskeletal:   Normal range of motion in all extremities. No joint effusions.  No lower extremity tenderness.  No edema. Neurologic:   Normal speech and language.  Cranial nerves II through XII intact Motor grossly intact. No acute focal neurologic deficits are appreciated.  Skin:    Skin is warm, dry and intact. No rash noted.  No petechiae, purpura, or bullae.  ____________________________________________    LABS (pertinent positives/negatives) (all labs ordered are listed, but only abnormal results are displayed) Labs Reviewed  APTT - Abnormal; Notable for the following components:      Result Value   aPTT 45 (*)    All other components within normal limits  CBC - Abnormal; Notable for the following components:   WBC 15.0 (*)    Hemoglobin 11.9 (*)    All other components within normal limits  DIFFERENTIAL - Abnormal; Notable for the following components:   Neutro Abs 9.8 (*)    Lymphs Abs 4.1 (*)    All other components within normal limits  COMPREHENSIVE METABOLIC PANEL - Abnormal; Notable for the following components:   Glucose, Bld 109 (*)    All other components within normal limits  RESPIRATORY PANEL BY RT PCR (FLU A&B, COVID)  PROTIME-INR  POC URINE PREG, ED   ____________________________________________   EKG    ____________________________________________    RADIOLOGY  CT HEAD WO CONTRAST  Result Date: 02/14/2020 CLINICAL DATA:  Migraine headache for 2 weeks, right-sided facial numbness EXAM: CT HEAD WITHOUT CONTRAST TECHNIQUE: Contiguous axial images were obtained from the base of the skull through the vertex without intravenous contrast. COMPARISON:  None. FINDINGS: Brain: No acute infarct or hemorrhage. Lateral ventricles and midline  structures are unremarkable. No acute extra-axial fluid collections. There is no mass effect. Vascular: No hyperdense vessel or unexpected calcification. Skull: Normal. Negative for fracture or focal lesion. Sinuses/Orbits: No acute finding. Other: None. IMPRESSION: 1. No acute intracranial process. Electronically Signed   By: Sharlet Salina M.D.   On: 02/14/2020 16:36    ____________________________________________   PROCEDURES Procedures  ____________________________________________  DIFFERENTIAL DIAGNOSIS   Intracranial tumor, intracranial hemorrhage, dehydration, viral syndrome, pregnancy complication, benign headache syndrome  CLINICAL IMPRESSION / ASSESSMENT AND PLAN / ED COURSE  Medications ordered in the ED: Medications  lactated ringers bolus 1,000 mL (1,000 mLs Intravenous New Bag/Given 02/14/20 1823)  ketorolac (TORADOL) 30 MG/ML injection 15 mg (15 mg Intravenous Given 02/14/20 1831)  metoCLOPramide (REGLAN) injection 10 mg (10 mg Intravenous Given  02/14/20 1831)  diphenhydrAMINE (BENADRYL) injection 25 mg (25 mg Intravenous Given 02/14/20 1830)    Pertinent labs & imaging results that were available during my care of the patient were reviewed by me and considered in my medical decision making (see chart for details).  Heather Shea was evaluated in Emergency Department on 02/14/2020 for the symptoms described in the history of present illness. She was evaluated in the context of the global COVID-19 pandemic, which necessitated consideration that the patient might be at risk for infection with the SARS-CoV-2 virus that causes COVID-19. Institutional protocols and algorithms that pertain to the evaluation of patients at risk for COVID-19 are in a state of rapid change based on information released by regulatory bodies including the CDC and federal and state organizations. These policies and algorithms were followed during the patient's care in the ED.   Patient  presents with subacute headache syndrome with other associated symptoms suggestive of a viral illness. Considering the patient's symptoms, medical history, and physical examination today, I have low suspicion for ischemic stroke, intracranial hemorrhage, meningitis, encephalitis, carotid or vertebral dissection, venous sinus thrombosis, MS, intracranial hypertension, glaucoma, CRAO, CRVO, or temporal arteritis.  CT head obtained which is negative.  Lab panel unremarkable, Covid test negative, pregnancy negative.  Patient given IV fluids and migraine cocktail.  Vital signs normal with tachycardia resolved with hydration.  We will continue naproxen and Zofran at home      ____________________________________________   FINAL CLINICAL IMPRESSION(S) / ED DIAGNOSES    Final diagnoses:  Nonintractable headache, unspecified chronicity pattern, unspecified headache type     ED Discharge Orders         Ordered    naproxen (NAPROSYN) 500 MG tablet  2 times daily with meals        02/14/20 2010    ondansetron (ZOFRAN ODT) 4 MG disintegrating tablet  Every 8 hours PRN        02/14/20 2010          Portions of this note were generated with dragon dictation software. Dictation errors may occur despite best attempts at proofreading.   Sharman Cheek, MD 02/14/20 2014

## 2020-02-14 NOTE — Discharge Instructions (Addendum)
Your CT scan of the head and lab tests were all okay today.  Your pregnancy test was negative.  Your Covid test was negative.  Please continue to take naproxen or another anti-inflammatory medicine at home to control your headache.  Zofran can be helpful for nausea.  Be sure to maintain good hydration as well.

## 2020-02-14 NOTE — ED Triage Notes (Signed)
Video interpreter Clydie Braun [phone] used for triage at this time.   Pt presents to ED via POV with c/o HA x 2 weeks. Pt states numbness to R side of her face. Pt states R side of her face went numb with R eye pain. Pt states symptoms started at approx 1000 this morning.   Pt with noted possible R sided facial droop, numbness to R face, no drift noted to arms, no numbness noted to arms/legs. Pt with equal grip strengths at this time.

## 2020-02-18 ENCOUNTER — Other Ambulatory Visit: Payer: Self-pay

## 2020-02-18 ENCOUNTER — Emergency Department: Payer: Self-pay

## 2020-02-18 ENCOUNTER — Emergency Department
Admission: EM | Admit: 2020-02-18 | Discharge: 2020-02-18 | Disposition: A | Discharge status: Home / Self Care | Payer: Self-pay | Attending: Emergency Medicine | Admitting: Emergency Medicine

## 2020-02-18 DIAGNOSIS — R42 Dizziness and giddiness: Secondary | ICD-10-CM | POA: Insufficient documentation

## 2020-02-18 DIAGNOSIS — R000 Tachycardia, unspecified: Secondary | ICD-10-CM | POA: Insufficient documentation

## 2020-02-18 DIAGNOSIS — R0789 Other chest pain: Secondary | ICD-10-CM | POA: Insufficient documentation

## 2020-02-18 LAB — URINALYSIS, COMPLETE (UACMP) WITH MICROSCOPIC
Bacteria, UA: NONE SEEN
Bilirubin Urine: NEGATIVE
Glucose, UA: NEGATIVE mg/dL
Ketones, ur: NEGATIVE mg/dL
Nitrite: NEGATIVE
Protein, ur: NEGATIVE mg/dL
Specific Gravity, Urine: 1.017 (ref 1.005–1.030)
WBC, UA: >50 WBC/hpf — ABNORMAL HIGH (ref 0–5)
pH: 5 (ref 5.0–8.0)

## 2020-02-18 LAB — COMPREHENSIVE METABOLIC PANEL
ALT: 18 U/L (ref 0–44)
AST: 16 U/L (ref 15–41)
Albumin: 3.8 g/dL (ref 3.5–5.0)
Alkaline Phosphatase: 62 U/L (ref 38–126)
Anion gap: 12 (ref 5–15)
BUN: 11 mg/dL (ref 6–20)
CO2: 22 mmol/L (ref 22–32)
Calcium: 8.4 mg/dL — ABNORMAL LOW (ref 8.9–10.3)
Chloride: 104 mmol/L (ref 98–111)
Creatinine, Ser: 0.75 mg/dL (ref 0.44–1.00)
GFR, Estimated: >60 mL/min (ref >60)
Glucose, Bld: 111 mg/dL — ABNORMAL HIGH (ref 70–99)
Potassium: 3.6 mmol/L (ref 3.5–5.1)
Sodium: 138 mmol/L (ref 135–145)
Total Bilirubin: 0.5 mg/dL (ref 0.3–1.2)
Total Protein: 7.4 g/dL (ref 6.5–8.1)

## 2020-02-18 LAB — CBC WITH DIFFERENTIAL/PLATELET
Abs Immature Granulocytes: 0.05 10*3/uL (ref 0.00–0.07)
Basophils Absolute: 0.1 10*3/uL (ref 0.0–0.1)
Basophils Relative: 1 %
Eosinophils Absolute: 0.2 10*3/uL (ref 0.0–0.5)
Eosinophils Relative: 1 %
HCT: 36.2 % (ref 36.0–46.0)
Hemoglobin: 11.5 g/dL — ABNORMAL LOW (ref 12.0–15.0)
Immature Granulocytes: 0 %
Lymphocytes Relative: 21 %
Lymphs Abs: 2.7 10*3/uL (ref 0.7–4.0)
MCH: 28 pg (ref 26.0–34.0)
MCHC: 31.8 g/dL (ref 30.0–36.0)
MCV: 88.1 fL (ref 80.0–100.0)
Monocytes Absolute: 0.5 10*3/uL (ref 0.1–1.0)
Monocytes Relative: 4 %
Neutro Abs: 9.3 10*3/uL — ABNORMAL HIGH (ref 1.7–7.7)
Neutrophils Relative %: 73 %
Platelets: 435 10*3/uL — ABNORMAL HIGH (ref 150–400)
RBC: 4.11 MIL/uL (ref 3.87–5.11)
RDW: 14.8 % (ref 11.5–15.5)
WBC: 12.8 10*3/uL — ABNORMAL HIGH (ref 4.0–10.5)
nRBC: 0 % (ref 0.0–0.2)

## 2020-02-18 LAB — FIBRIN DERIVATIVES D-DIMER (ARMC ONLY): Fibrin derivatives D-dimer (ARMC): 367.51 ng/mL (FEU) (ref 0.00–499.00)

## 2020-02-18 LAB — POC URINE PREG, ED: Preg Test, Ur: NEGATIVE

## 2020-02-18 LAB — TROPONIN I (HIGH SENSITIVITY): Troponin I (High Sensitivity): <2 ng/L (ref <18)

## 2020-02-18 MED ORDER — DROPERIDOL 2.5 MG/ML IJ SOLN
1.2500 mg | Freq: Once | INTRAMUSCULAR | Status: AC
  Administered 2020-02-18: 1.25 mg via INTRAVENOUS
  Filled 2020-02-18: qty 2

## 2020-02-18 MED ORDER — KETOROLAC TROMETHAMINE 30 MG/ML IJ SOLN
15.0000 mg | Freq: Once | INTRAMUSCULAR | Status: AC
  Administered 2020-02-18: 15 mg via INTRAVENOUS
  Filled 2020-02-18: qty 1

## 2020-02-18 MED ORDER — LACTATED RINGERS IV BOLUS
1000.0000 mL | Freq: Once | INTRAVENOUS | Status: AC
  Administered 2020-02-18: 1000 mL via INTRAVENOUS

## 2020-02-18 MED ORDER — LORAZEPAM 2 MG/ML IJ SOLN
1.0000 mg | Freq: Once | INTRAMUSCULAR | Status: AC
  Administered 2020-02-18: 1 mg via INTRAVENOUS
  Filled 2020-02-18: qty 1

## 2020-02-18 NOTE — ED Notes (Signed)
NAD noted at time of D/C. Pt ambulatory to lobby at this time. Pt refused wheelchair to the lobby. Rafael, Interpreter at bedside to review D/C instructions with the patient.

## 2020-02-18 NOTE — ED Notes (Signed)
Medication administered as ordered. Pt remains in NSR on the monitor. Pt endorses having a ride home. Call bell within reach. Pt states understanding to use call bell should needs arise. Pt denies further needs.

## 2020-02-18 NOTE — ED Triage Notes (Addendum)
Pt arrived via POV with reports of dizziness and chills and pain in chest since Thursday night after receiving a shot during her recent ED visit.  Pt states she waited to come to hospital because she thought the sxs would go away.  Pt is in no distress at this time, respirations equal and unlabored.  Triage Assessment completed via [employer] Spanish Interpreter Adal [phone]

## 2020-02-18 NOTE — ED Notes (Signed)
Pt presenting today with chest and hand (cooldness) with no other concerns after receiving medication a few days ago for migraine.

## 2020-02-18 NOTE — ED Notes (Signed)
Pt notified this RN that ride had arrived, this RN requested in person interpreter for discharge.

## 2020-02-18 NOTE — ED Notes (Signed)
Pt returned from X-ray at this time. This RN to bedside, VIR used for interaction with patient.

## 2020-02-18 NOTE — ED Provider Notes (Signed)
[facility] Emergency Department Provider Note ____________________________________________   First MD Initiated Contact with Patient 02/18/20 [phone]     (approximate)  I have reviewed the triage vital signs and the nursing notes.  HISTORY  Chief Complaint Dizziness   HPI Heather Shea Heather Shea is a 24 y.o. femalewho presents to the ED for evaluation of dizziness and chest pain.   Chart review indicates patient seen in the ED twice in the past month for similar.  Always tachycardic with rates 110-120 within each of her ED visits and recent history.  Patient last seen 4 days ago for headache, diagnosed with a migraine, had resolution of symptoms after Toradol/Reglan/Benadryl and discharged home.  Patient reports that she felt better after the medications above, but after after she got home that night noticed left-sided sharp chest pain that has been constant for the past 4 days.  She thought the sensation would have gone away by now, and reports being concerned about her heart because she has had 4 days of chest pain.  She reports associated chills as her only other complaint.  Denies sweats, documented fevers, syncope, headache, shortness of breath, cough, exertional or pleuritic component to her pain.  Currently reporting 5/10 intensity left-sided sharp chest pain that is nonradiating.  History obtained with the assistance of video interpreter in Spanish  Past Medical History:  Diagnosis Date  . History of prior pregnancy with SGA newborn     Patient Active Problem List   Diagnosis Date Noted  . Normal labor 12/05/2017  . Postpartum care following vaginal delivery 12/05/2017  . Delivery normal 12/05/2017  . First trimester screening     Past Surgical History:  Procedure Laterality Date  . NO PAST SURGERIES      Prior to Admission medications   Medication Sig Start Date End Date Taking? Authorizing Provider  dicyclomine (BENTYL) 10 MG capsule Take 1  capsule (10 mg total) by mouth 4 (four) times daily -  before meals and at bedtime for 7 days. 01/09/20 01/16/20  Minna Antis, MD  naproxen (NAPROSYN) 500 MG tablet Take 1 tablet (500 mg total) by mouth 2 (two) times daily with a meal. 02/14/20   Sharman Cheek, MD  ondansetron (ZOFRAN ODT) 4 MG disintegrating tablet Take 1 tablet (4 mg total) by mouth every 8 (eight) hours as needed for nausea or vomiting. 02/14/20   Sharman Cheek, MD  Prenatal Vit-Fe Fumarate-FA (PRENATAL MULTIVITAMIN) TABS tablet Take 1 tablet by mouth daily at 12 noon.    [provider]    Allergies Patient has no known allergies.  Family History  Problem Relation Age of Onset  . Cancer Neg Hx   . Diabetes Neg Hx   . Hypertension Neg Hx     Social History Social History   Tobacco Use  . Smoking status: Never Smoker  . Smokeless tobacco: Never Used  Substance Use Topics  . Alcohol use: No  . Drug use: No    Review of Systems  Constitutional: Positive for subjective chills without documented fever Eyes: No visual changes. ENT: No sore throat. Cardiovascular: Positive for chest pain. Respiratory: Denies shortness of breath. Gastrointestinal: No abdominal pain.  No nausea, no vomiting.  No diarrhea.  No constipation. Genitourinary: Negative for dysuria. Musculoskeletal: Negative for back pain. Skin: Negative for rash. Neurological: Negative for headaches, focal weakness or numbness.  ____________________________________________   PHYSICAL EXAM:  VITAL SIGNS: Vitals:   02/18/20 0540 02/18/20 0641  BP: 119/73 110/62  Pulse: Marland Kitchen)  123 79  Resp: 18 (!) 29  Temp: 98.3 F (36.8 C)   SpO2: 99% 100%     Constitutional: Alert and oriented. Well appearing and in no acute distress.  Conversational without distress via interpreter Eyes: Conjunctivae are normal. PERRL. EOMI. Head: Atraumatic. Nose: No congestion/rhinnorhea. Mouth/Throat: Mucous membranes are moist.  Oropharynx  non-erythematous. Neck: No stridor. No cervical spine tenderness to palpation. Cardiovascular: Tachycardic rate, regular rhythm. Grossly normal heart sounds.  Good peripheral circulation. Respiratory: Normal respiratory effort.  No retractions. Lungs CTAB. Gastrointestinal: Soft , nondistended, nontender to palpation. No CVA tenderness. Musculoskeletal: No lower extremity tenderness nor edema.  No joint effusions. No signs of acute trauma. Tenderness to palpation of the affected area, left midclavicular third/fourth intercostal space, and this reproduces her symptoms.  No overlying skin changes, signs of trauma, induration or bony step-offs. Neurologic:  Normal speech and language. No gross focal neurologic deficits are appreciated. No gait instability noted. Skin:  Skin is warm, dry and intact. No rash noted. Psychiatric: Mood and affect are normal. Speech and behavior are normal.  ____________________________________________   LABS (all labs ordered are listed, but only abnormal results are displayed)  Labs Reviewed  CBC WITH DIFFERENTIAL/PLATELET - Abnormal; Notable for the following components:      Result Value   WBC 12.8 (*)    Hemoglobin 11.5 (*)    Platelets 435 (*)    Neutro Abs 9.3 (*)    All other components within normal limits  URINALYSIS, COMPLETE (UACMP) WITH MICROSCOPIC - Abnormal; Notable for the following components:   Color, Urine YELLOW (*)    APPearance CLOUDY (*)    Hgb urine dipstick MODERATE (*)    Leukocytes,Ua LARGE (*)    WBC, UA >50 (*)    All other components within normal limits  URINE CULTURE  FIBRIN DERIVATIVES D-DIMER ([facility] ONLY)  COMPREHENSIVE METABOLIC PANEL  POC URINE PREG, ED  TROPONIN I (HIGH SENSITIVITY)   ____________________________________________  12 Lead EKG  Sinus rhythm, rate of 76 bpm.  Normal axis and intervals.  No evidence of acute ischemia. ____________________________________________  RADIOLOGY  ED MD interpretation:      Official radiology report(s): No results found.  ____________________________________________   PROCEDURES and INTERVENTIONS  Procedure(s) performed (including Critical Care):  .1-3 Lead EKG Interpretation Performed by: Delton Prairie, MD Authorized by: Delton Prairie, MD     Interpretation: abnormal     ECG rate:  104   ECG rate assessment: tachycardic     Rhythm: sinus tachycardia     Ectopy: none     Conduction: normal      Medications  LORazepam (ATIVAN) injection 1 mg (has no administration in time range)  lactated ringers bolus 1,000 mL (1,000 mLs Intravenous New Bag/Given 02/18/20 0636)  ketorolac (TORADOL) 30 MG/ML injection 15 mg (15 mg Intravenous Given 02/18/20 0636)  droperidol (INAPSINE) 2.5 MG/ML injection 1.25 mg (1.25 mg Intravenous Given 02/18/20 0635)    ____________________________________________   MDM / ED COURSE   Otherwise healthy 24 year old female presents to the ED with left-sided chest pain for the past 4 days, likely MSK in etiology, but requiring rule out of PE.  Patient presented tachycardic, otherwise normal vital signs on room air.  Exam demonstrates reproducible chest pain on palpation without overlying skin changes or signs of trauma.  She otherwise looks well without distress.  Urine with some infectious features, patient has no symptoms or abdominal tenderness, so we will send for urine culture and not treat its time.  D-dimer pending at the time of this writing and sign out to assess for acute PE in the setting of chest pain and tachycardia.  Her EKG is nonischemic and I do anticipate evidence of ACS or cardiac pathology as a source of her symptoms.  Will sign patient out to oncoming provider to follow-up D-dimer and perform CTA chest if elevated for evaluation of PE.  Patient already having resolving chest pain after Toradol administration.   Clinical Course as of Feb 18 707  Wynelle Link Feb 18, 2020  0708 Reassessed.  Patient reports improving  chest pain, but now is reporting severe anxiety.  She is restless in bed and request to leave.  I educate her of our incomplete work-up and need for blood work to result and possible CT scan.  She is agreeable to stay.  1 mg of IV Ativan ordered. I further asked her about urinary symptoms, she declines any dysuria, incontinence, sensation of incomplete emptying or lower abdominal pain.  We will send her urine for culturePatient signed out to oncoming provider.   [DS]    Clinical Course User Index [DS] Delton Prairie, MD    ____________________________________________   FINAL CLINICAL IMPRESSION(S) / ED DIAGNOSES  Final diagnoses:  Other chest pain  Sinus tachycardia  Dizziness     ED Discharge Orders    None       Heather Shea   Note:  This document was prepared using Dragon voice recognition software and may include unintentional dictation errors.   Delton Prairie, MD 02/18/20 [phone]

## 2020-02-18 NOTE — ED Notes (Signed)
This RN to bedside, pt assisted to the bathroom by this RN. Pt instructed to call for her ride at this time.

## 2020-02-19 LAB — URINE CULTURE

## 2022-05-15 IMAGING — CT CT HEAD W/O CM
3 series · 15 of 45 positions shown, 18 images · non-contrast
Comparison: None.

CLINICAL DATA: Migraine headache for 2 weeks, right-sided facial
numbness

EXAM:
CT HEAD WITHOUT CONTRAST
TECHNIQUE: Contiguous axial images were obtained from the base of the skull
through the vertex without intravenous contrast.

[Series 2: head wo · axial · 0.40mm/px · z∈[-131,-16]mm · 9 of 28 slices shown, 12 images]
[im 3/28  brain]
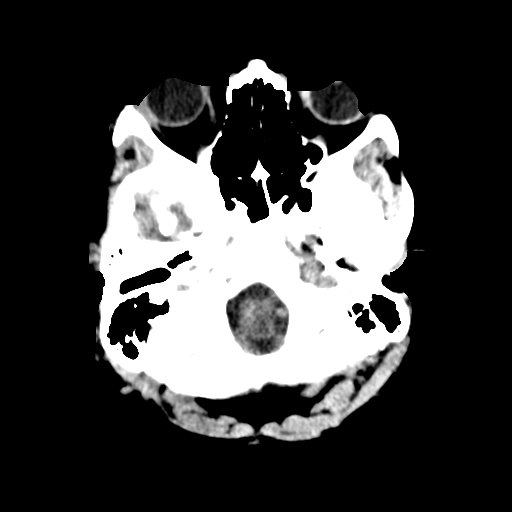
[im 3/28  bone]
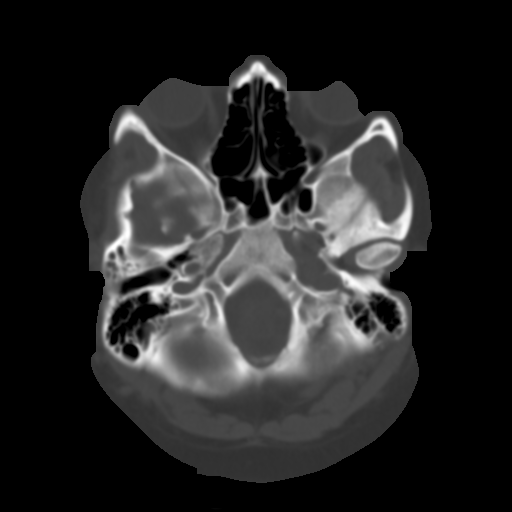
[im 6/28  brain]
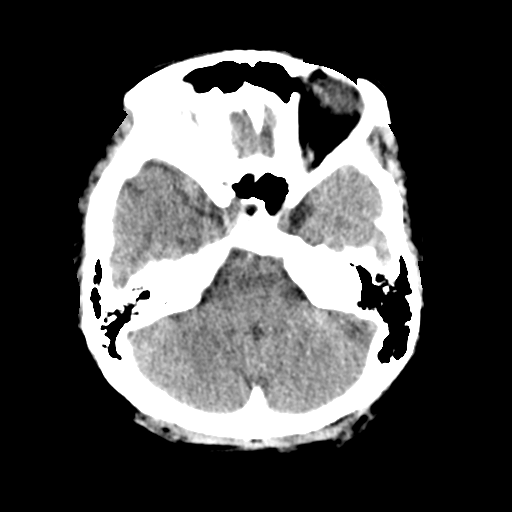
[im 9/28  brain]
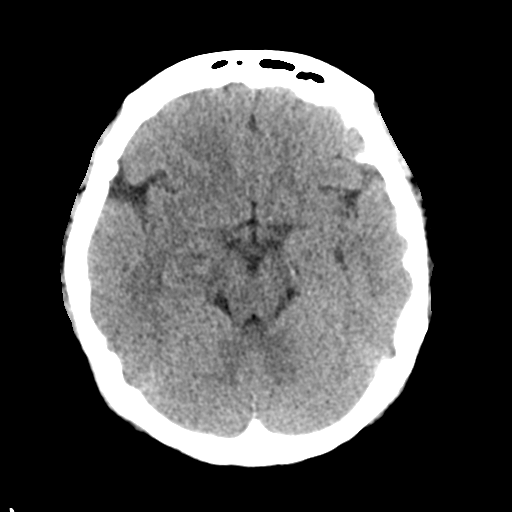
[im 12/28  brain]
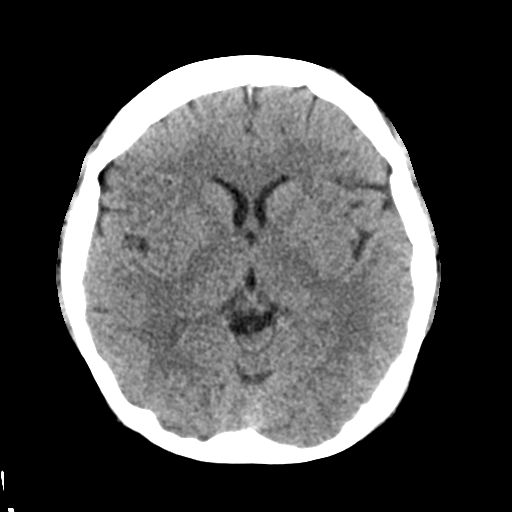
[im 15/28  brain]
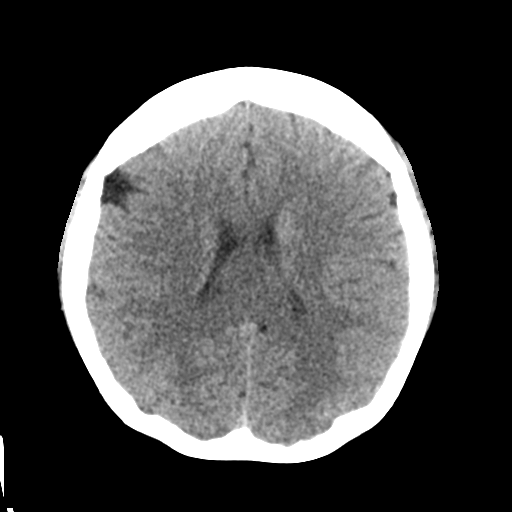
[im 15/28  bone]
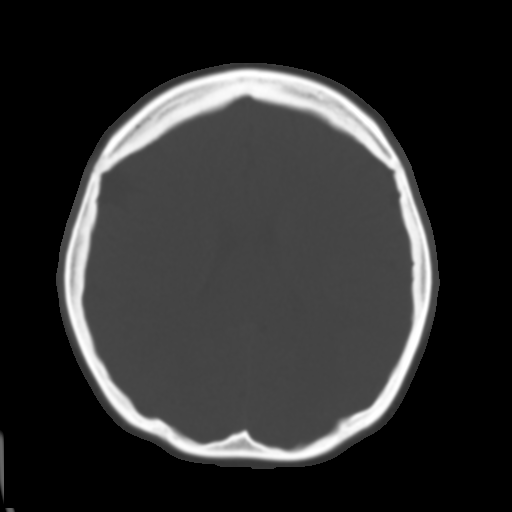
[im 17/28  brain]
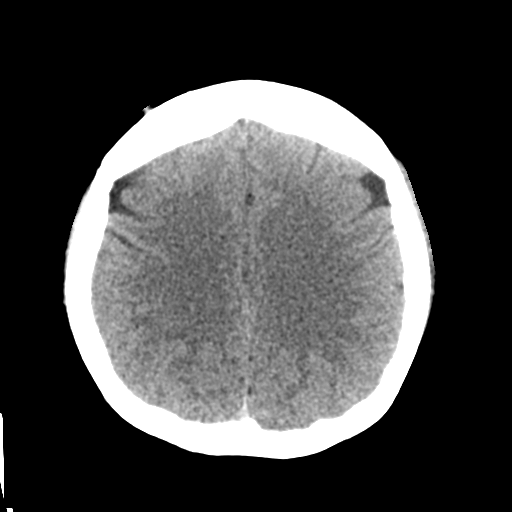
[im 20/28  brain]
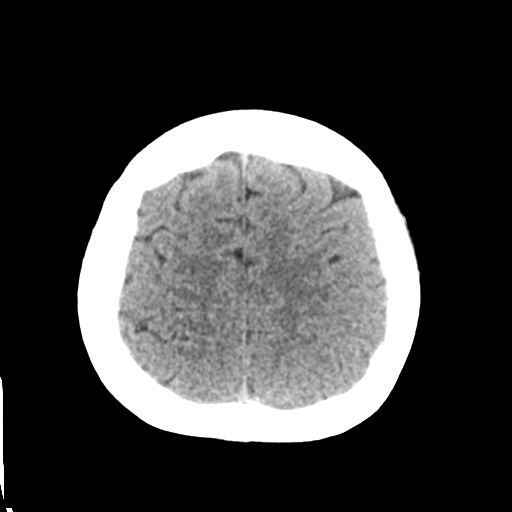
[im 23/28  brain]
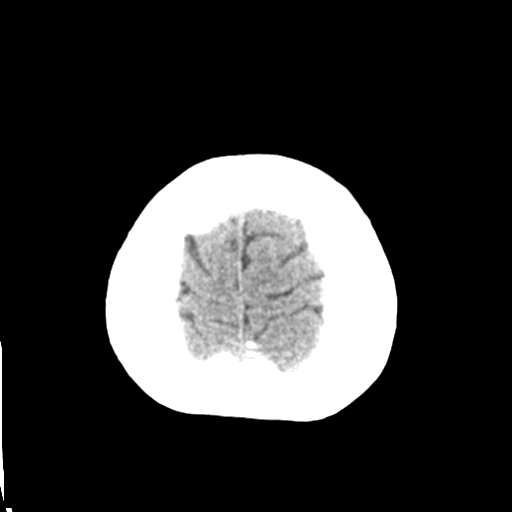
[im 26/28  brain]
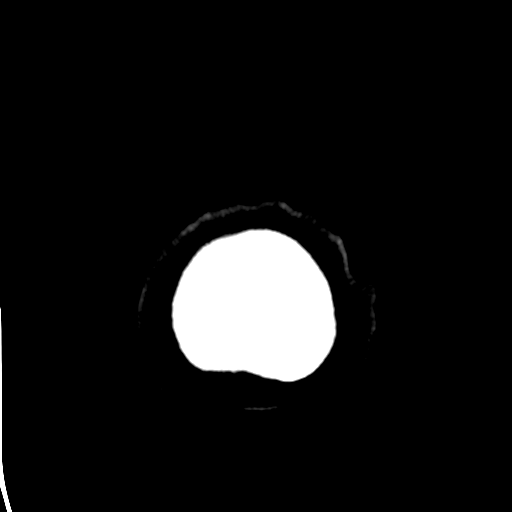
[im 26/28  bone]
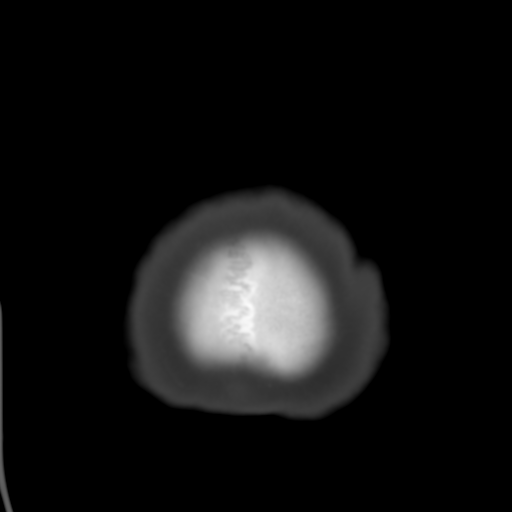

[Series 4: coronal soft tissue · coronal · 0.29mm/px · 3 of 55 slices shown]
[im 19/55  brain]
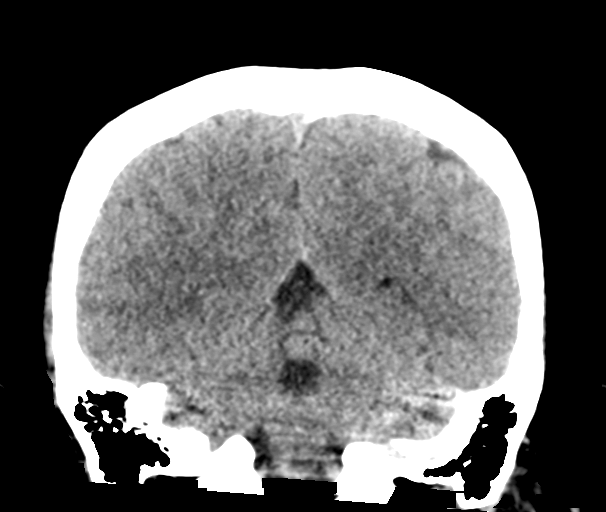
[im 25/55  brain]
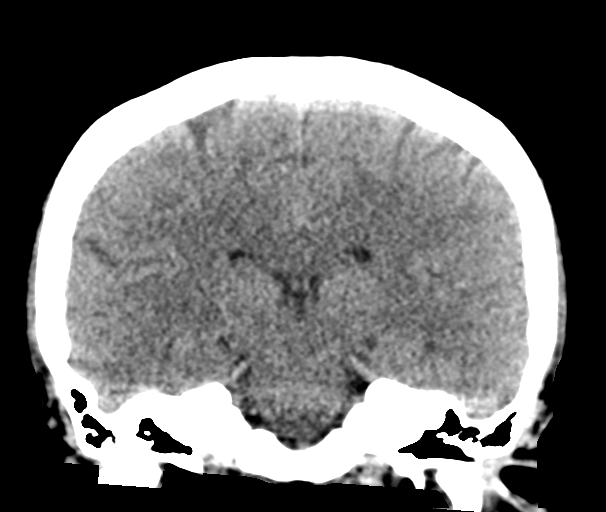
[im 31/55  brain]
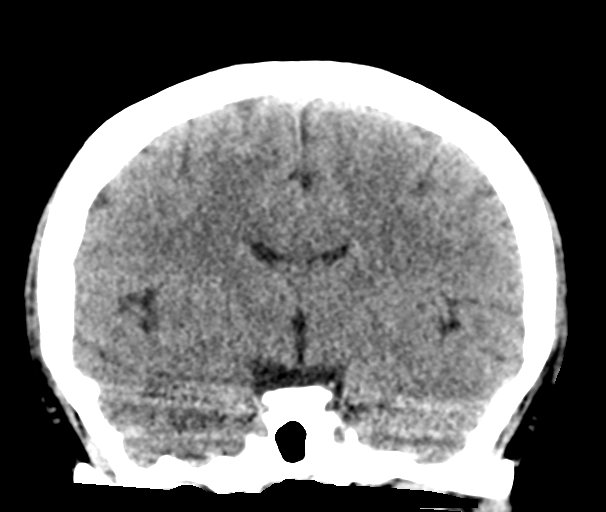

[Series 5: sagittal soft tissue · sagittal · 0.29mm/px · 3 of 54 slices shown]
[im 18/54  brain]
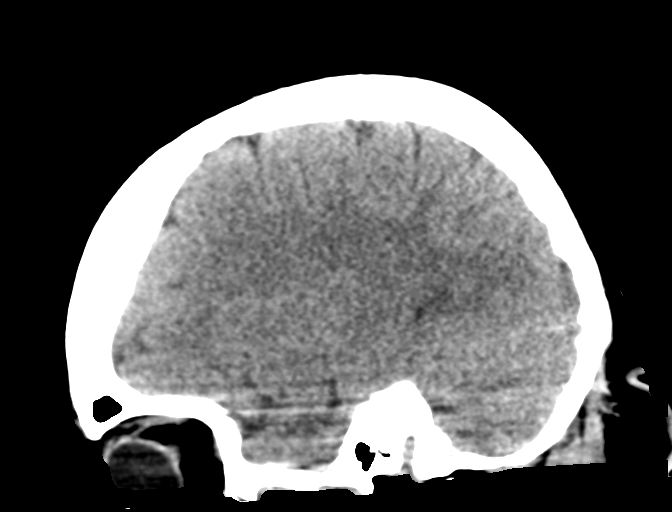
[im 27/54  brain]
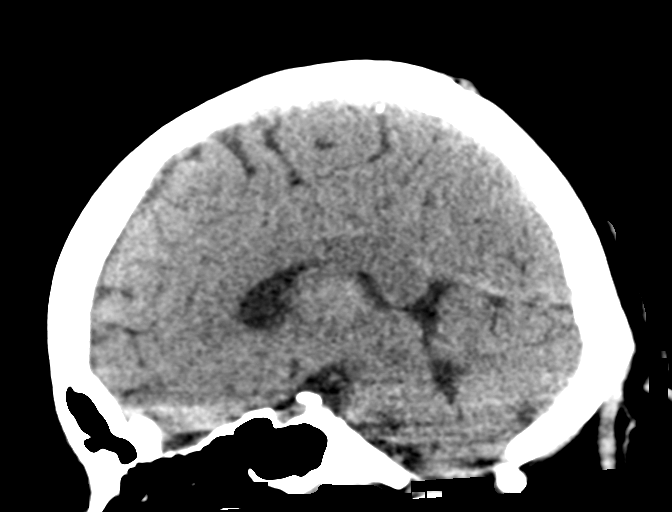
[im 36/54  brain]
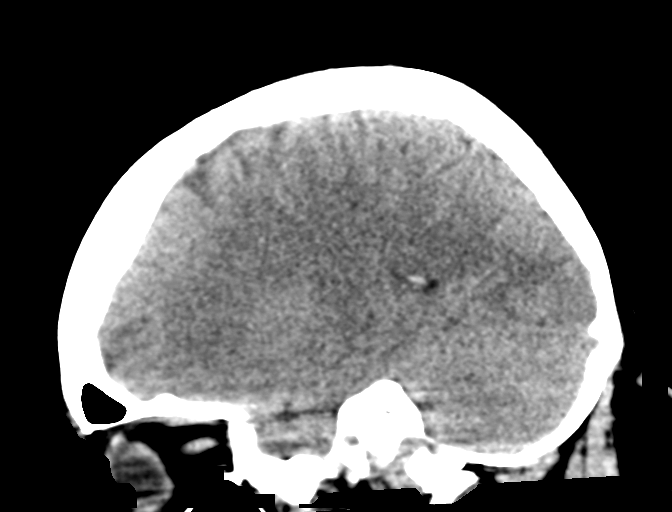

[15 of 45 positions shown; findings below may reference images not displayed]

FINDINGS: Brain: No acute infarct or hemorrhage. Lateral ventricles and
midline structures are unremarkable. No acute extra-axial fluid
collections. There is no mass effect.

Vascular: No hyperdense vessel or unexpected calcification.

Skull: Normal. Negative for fracture or focal lesion.

Sinuses/Orbits: No acute finding.

Other: None.
IMPRESSION: 1. No acute intracranial process.
